# Patient Record
Sex: Male | Born: 1990 | Race: Black or African American | Hispanic: No | Marital: Single | Smoking: Former smoker
Health system: Southern US, Community
[De-identification: ages and names within clinical notes are randomized; demographics above are authoritative.]

## PROBLEM LIST (undated history)

## (undated) DIAGNOSIS — F4310 Post-traumatic stress disorder, unspecified: Secondary | ICD-10-CM

## (undated) DIAGNOSIS — J45909 Unspecified asthma, uncomplicated: Secondary | ICD-10-CM

## (undated) DIAGNOSIS — F1010 Alcohol abuse, uncomplicated: Secondary | ICD-10-CM

## (undated) DIAGNOSIS — F419 Anxiety disorder, unspecified: Secondary | ICD-10-CM

## (undated) DIAGNOSIS — F319 Bipolar disorder, unspecified: Secondary | ICD-10-CM

---
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
## 2010-08-21 IMAGING — CR DG ANKLE COMPLETE 3+V*L*
3 series · 3 of 3 positions shown · non-contrast
Comparison: No prior studies

CLINICAL DATA: Pain in the region of the lateral malleolus.

LEFT ANKLE COMPLETE - 3+ VIEW

[view not recorded (1 of 3)]
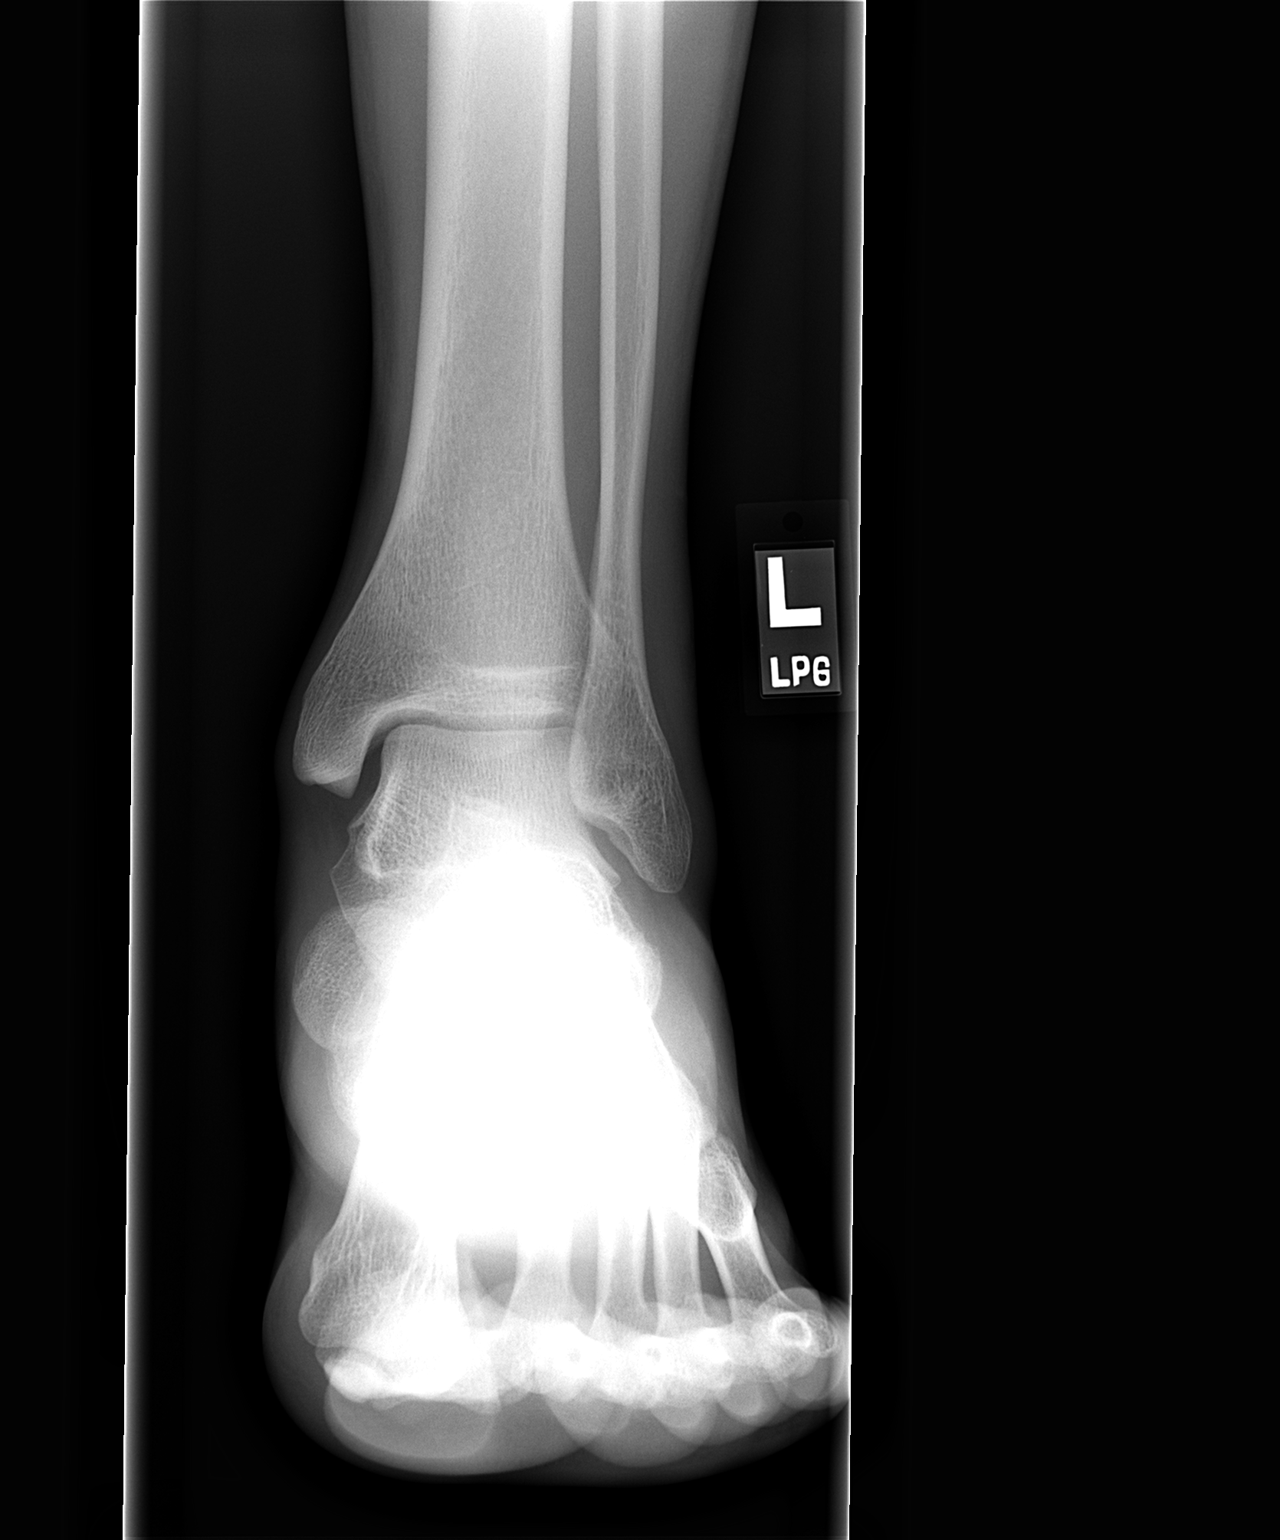

[view not recorded (2 of 3)]
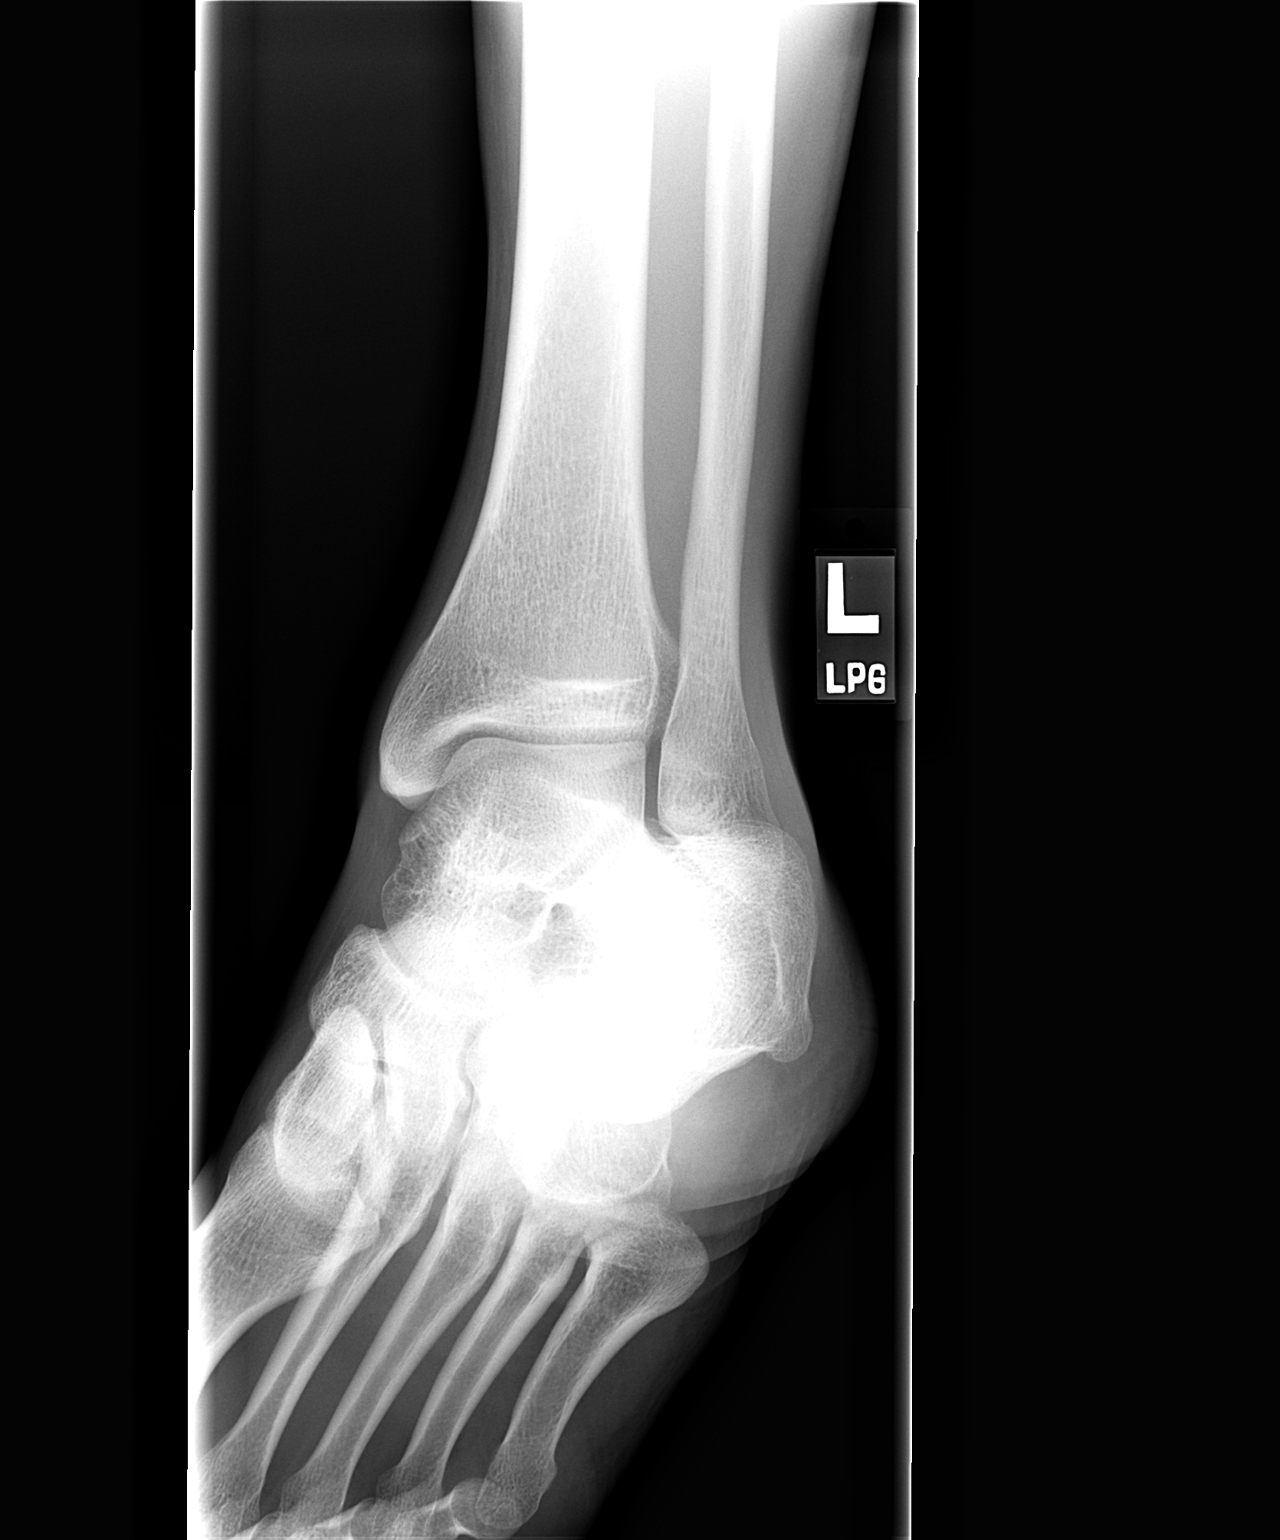

[view not recorded (3 of 3)]
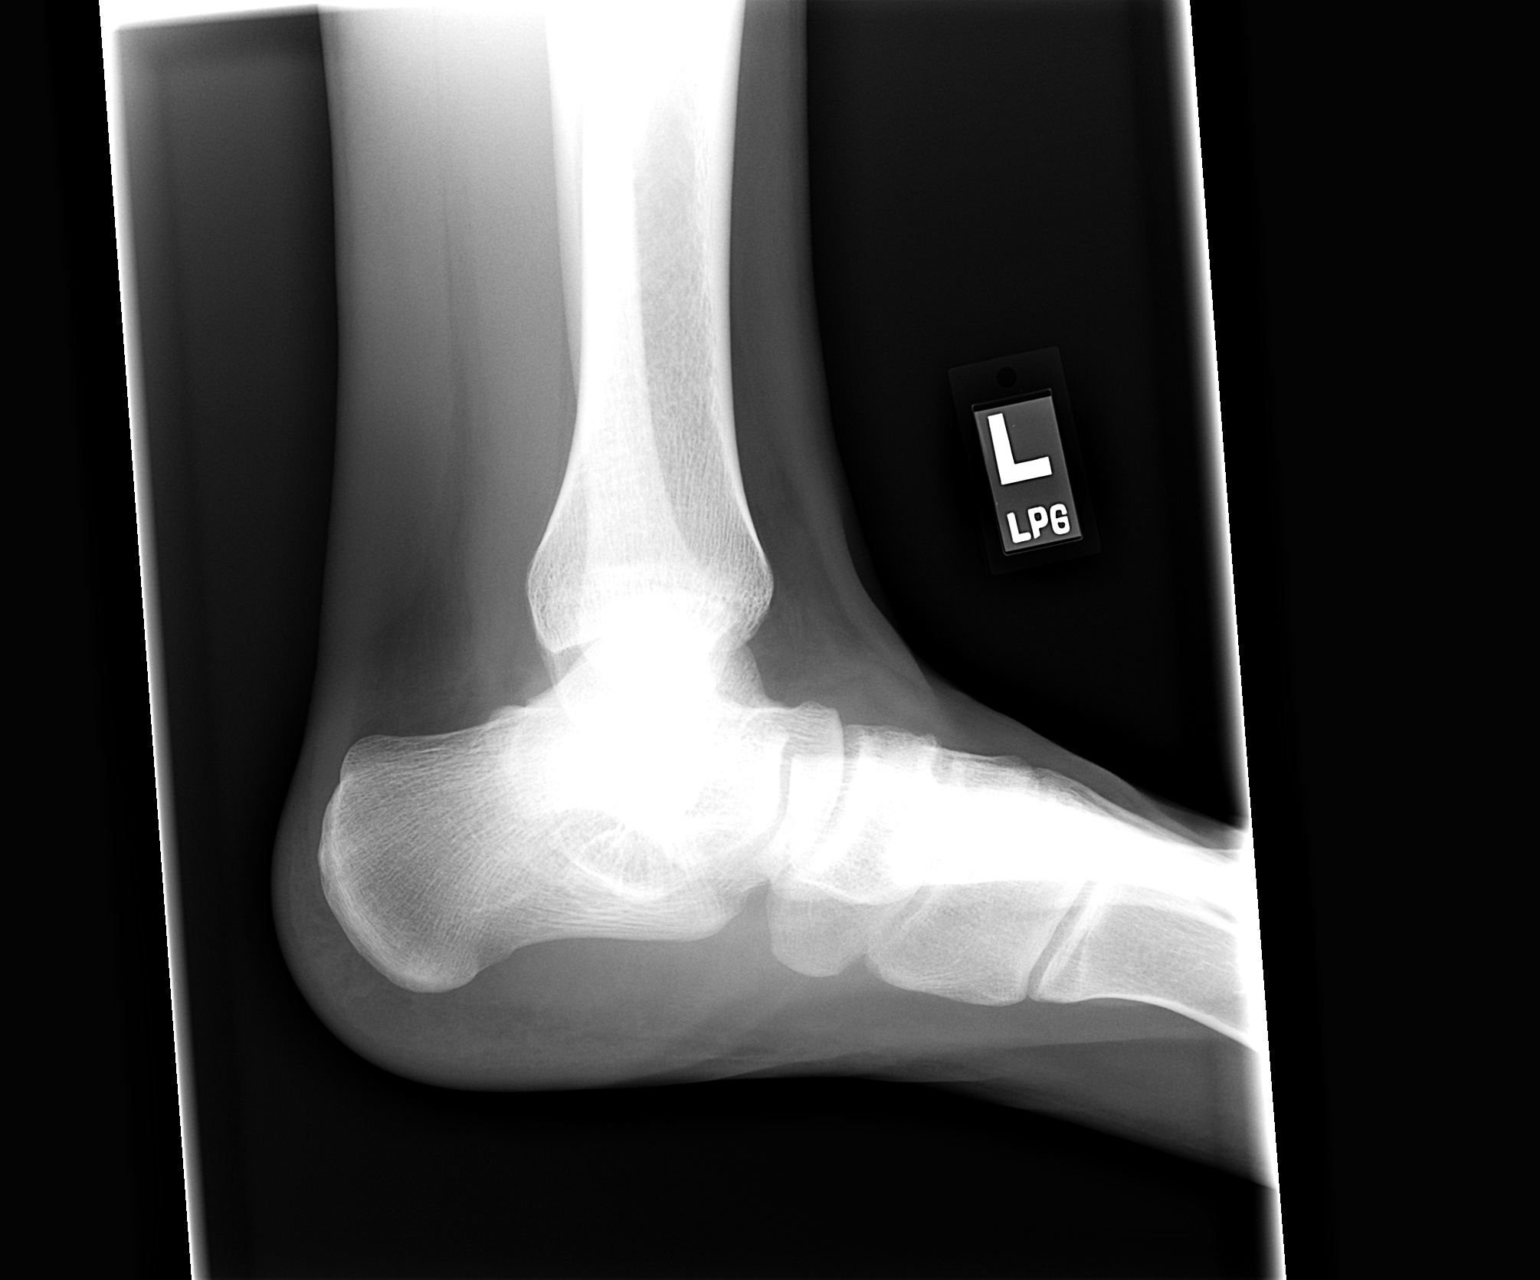

[3 of 3 positions shown; findings below may reference images not displayed]

FINDINGS: There are no fractures or dislocations.  The tibio -
talar joint space is normally maintained.  There are no destructive
changes.
IMPRESSION: Negative study.

## 2011-05-18 LAB — URINE MICROSCOPIC-ADD ON

## 2011-05-18 LAB — URINALYSIS, ROUTINE W REFLEX MICROSCOPIC
Bilirubin Urine: NEGATIVE
Glucose, UA: NEGATIVE
Hgb urine dipstick: NEGATIVE
Ketones, ur: NEGATIVE
Leukocytes, UA: NEGATIVE
Nitrite: NEGATIVE
Protein, ur: 30 — AB
Specific Gravity, Urine: 1.024
Urobilinogen, UA: 0.2
pH: 7

## 2011-05-18 LAB — URINE CULTURE: Colony Count: 6000

## 2011-05-18 LAB — GC/CHLAMYDIA PROBE AMP, GENITAL
Chlamydia, DNA Probe: NEGATIVE
GC Probe Amp, Genital: NEGATIVE

## 2015-10-23 ENCOUNTER — Emergency Department (HOSPITAL_COMMUNITY)
Admission: EM | Admit: 2015-10-23 | Discharge: 2015-10-23 | Disposition: A | Discharge status: Home / Self Care | Payer: Medicaid Other | Attending: Emergency Medicine | Admitting: Emergency Medicine

## 2015-10-23 ENCOUNTER — Emergency Department (HOSPITAL_COMMUNITY): Payer: Medicaid Other

## 2015-10-23 ENCOUNTER — Encounter (HOSPITAL_COMMUNITY): Payer: Self-pay | Admitting: Emergency Medicine

## 2015-10-23 DIAGNOSIS — S93401A Sprain of unspecified ligament of right ankle, initial encounter: Secondary | ICD-10-CM | POA: Diagnosis not present

## 2015-10-23 DIAGNOSIS — Z880 Allergy status to penicillin: Secondary | ICD-10-CM | POA: Insufficient documentation

## 2015-10-23 DIAGNOSIS — F17200 Nicotine dependence, unspecified, uncomplicated: Secondary | ICD-10-CM | POA: Diagnosis not present

## 2015-10-23 DIAGNOSIS — Y9289 Other specified places as the place of occurrence of the external cause: Secondary | ICD-10-CM | POA: Insufficient documentation

## 2015-10-23 DIAGNOSIS — W1789XA Other fall from one level to another, initial encounter: Secondary | ICD-10-CM | POA: Insufficient documentation

## 2015-10-23 DIAGNOSIS — Y999 Unspecified external cause status: Secondary | ICD-10-CM | POA: Insufficient documentation

## 2015-10-23 DIAGNOSIS — S99911A Unspecified injury of right ankle, initial encounter: Secondary | ICD-10-CM | POA: Diagnosis present

## 2015-10-23 DIAGNOSIS — Y9339 Activity, other involving climbing, rappelling and jumping off: Secondary | ICD-10-CM | POA: Diagnosis not present

## 2015-10-23 MED ORDER — TRAMADOL HCL 50 MG PO TABS
50.0000 mg | ORAL_TABLET | Freq: Once | ORAL | Status: AC
  Administered 2015-10-23: 50 mg via ORAL
  Filled 2015-10-23: qty 1

## 2015-10-23 MED ORDER — NAPROXEN 500 MG PO TABS: 500.0000 mg | ORAL_TABLET | Freq: Two times a day (BID) | ORAL | Status: DC

## 2015-10-23 MED ORDER — TRAMADOL HCL 50 MG PO TABS: 50.0000 mg | ORAL_TABLET | Freq: Four times a day (QID) | ORAL | Status: DC | PRN
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
## 2015-10-23 NOTE — ED Notes (Signed)
Ortho paged. 

## 2015-10-23 NOTE — Discharge Instructions (Signed)
Elevate the area, apply ice and take the medication as directed. Do not take the narcotic if driving as it will make you sleepy. Follow up with DR. Eulah PontMurphy if symptoms persist.
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
## 2017-08-30 ENCOUNTER — Emergency Department (HOSPITAL_COMMUNITY)
Admission: EM | Admit: 2017-08-30 | Discharge: 2017-08-30 | Disposition: A | Discharge status: Home / Self Care | Payer: Self-pay | Attending: Emergency Medicine | Admitting: Emergency Medicine

## 2017-08-30 ENCOUNTER — Encounter (HOSPITAL_COMMUNITY): Payer: Self-pay

## 2017-08-30 DIAGNOSIS — F17200 Nicotine dependence, unspecified, uncomplicated: Secondary | ICD-10-CM | POA: Insufficient documentation

## 2017-08-30 DIAGNOSIS — F10920 Alcohol use, unspecified with intoxication, uncomplicated: Secondary | ICD-10-CM | POA: Insufficient documentation

## 2017-08-30 LAB — CBG MONITORING, ED: Glucose-Capillary: 102 mg/dL — ABNORMAL HIGH (ref 65–99)

## 2017-08-30 MED ORDER — ONDANSETRON 4 MG PO TBDP
4.0000 mg | ORAL_TABLET | Freq: Once | ORAL | Status: AC
  Administered 2017-08-30: 4 mg via ORAL
  Filled 2017-08-30: qty 1

## 2017-08-30 NOTE — ED Notes (Signed)
Bed: WLPT4 Expected date:  Expected time:  Means of arrival:  Comments: 
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
## 2017-08-30 NOTE — ED Provider Notes (Signed)
Patient here for alcohol intoxication.  I assume the care from Dr. Elesa MassedWard at 8:00 this morning.  Please see her note for additional details.  Plan is to allow the patient to metabolize to freedom.  1:37 PM Patient is awake and alert, able to ambulate. The patient is safe for discharge with strict return precautions.  Disposition: Discharge  Condition: Good  I have discussed the results, Dx and Tx plan with the patient who expressed understanding and agree(s) with the plan. Discharge instructions discussed at great length. The patient was given strict return precautions who verbalized understanding of the instructions. No further questions at time of discharge.    ED Discharge Orders    None        Dakota Williams, Dakota GarnetPedro Eduardo, MD 08/30/17 1337
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
## 2017-08-30 NOTE — ED Notes (Signed)
Patient can find a ride home Dr Eudelia Bunchardama will discharge patient. Patient made aware.

## 2017-08-30 NOTE — ED Notes (Signed)
ED Provider at bedside. 

## 2018-03-08 ENCOUNTER — Emergency Department (HOSPITAL_COMMUNITY)
Admission: EM | Admit: 2018-03-08 | Discharge: 2018-03-08 | Payer: Self-pay | Attending: Emergency Medicine | Admitting: Emergency Medicine

## 2018-03-08 ENCOUNTER — Other Ambulatory Visit: Payer: Self-pay

## 2018-03-08 DIAGNOSIS — S0083XA Contusion of other part of head, initial encounter: Secondary | ICD-10-CM | POA: Insufficient documentation

## 2018-03-08 DIAGNOSIS — Y929 Unspecified place or not applicable: Secondary | ICD-10-CM | POA: Insufficient documentation

## 2018-03-08 DIAGNOSIS — Z23 Encounter for immunization: Secondary | ICD-10-CM | POA: Insufficient documentation

## 2018-03-08 DIAGNOSIS — Y9389 Activity, other specified: Secondary | ICD-10-CM | POA: Insufficient documentation

## 2018-03-08 DIAGNOSIS — F17200 Nicotine dependence, unspecified, uncomplicated: Secondary | ICD-10-CM | POA: Insufficient documentation

## 2018-03-08 DIAGNOSIS — Y998 Other external cause status: Secondary | ICD-10-CM | POA: Insufficient documentation

## 2018-03-08 DIAGNOSIS — T148XXA Other injury of unspecified body region, initial encounter: Secondary | ICD-10-CM

## 2018-03-08 DIAGNOSIS — S30811A Abrasion of abdominal wall, initial encounter: Secondary | ICD-10-CM | POA: Insufficient documentation

## 2018-03-08 MED ORDER — TETANUS-DIPHTH-ACELL PERTUSSIS 5-2.5-18.5 LF-MCG/0.5 IM SUSP
0.5000 mL | Freq: Once | INTRAMUSCULAR | Status: AC
  Administered 2018-03-08: 0.5 mL via INTRAMUSCULAR
  Filled 2018-03-08: qty 0.5

## 2018-03-08 NOTE — Discharge Instructions (Addendum)
Apply ice to your face. Return to ER if you have any vision problems, confusion, vomiting, or balance problems.
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
## 2018-03-08 NOTE — ED Notes (Signed)
Bed: WLPT2 Expected date:  Expected time:  Means of arrival:  Comments: 
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
## 2018-03-24 ENCOUNTER — Emergency Department (HOSPITAL_COMMUNITY): Payer: Self-pay

## 2018-03-24 ENCOUNTER — Other Ambulatory Visit: Payer: Self-pay

## 2018-03-24 ENCOUNTER — Encounter (HOSPITAL_COMMUNITY): Payer: Self-pay | Admitting: *Deleted

## 2018-03-24 ENCOUNTER — Emergency Department (HOSPITAL_COMMUNITY)
Admission: EM | Admit: 2018-03-24 | Discharge: 2018-03-24 | Disposition: A | Discharge status: Home / Self Care | Payer: Self-pay | Attending: Emergency Medicine | Admitting: Emergency Medicine

## 2018-03-24 DIAGNOSIS — Z79899 Other long term (current) drug therapy: Secondary | ICD-10-CM | POA: Insufficient documentation

## 2018-03-24 DIAGNOSIS — F419 Anxiety disorder, unspecified: Secondary | ICD-10-CM | POA: Insufficient documentation

## 2018-03-24 DIAGNOSIS — J45909 Unspecified asthma, uncomplicated: Secondary | ICD-10-CM | POA: Insufficient documentation

## 2018-03-24 DIAGNOSIS — R42 Dizziness and giddiness: Secondary | ICD-10-CM | POA: Insufficient documentation

## 2018-03-24 DIAGNOSIS — F10920 Alcohol use, unspecified with intoxication, uncomplicated: Secondary | ICD-10-CM | POA: Insufficient documentation

## 2018-03-24 DIAGNOSIS — F17200 Nicotine dependence, unspecified, uncomplicated: Secondary | ICD-10-CM | POA: Insufficient documentation

## 2018-03-24 DIAGNOSIS — R0602 Shortness of breath: Secondary | ICD-10-CM | POA: Insufficient documentation

## 2018-03-24 DIAGNOSIS — Y906 Blood alcohol level of 120-199 mg/100 ml: Secondary | ICD-10-CM | POA: Insufficient documentation

## 2018-03-24 HISTORY — DX: Anxiety disorder, unspecified: F41.9

## 2018-03-24 HISTORY — DX: Unspecified asthma, uncomplicated: J45.909

## 2018-03-24 HISTORY — DX: Bipolar disorder, unspecified: F31.9

## 2018-03-24 LAB — CBC WITH DIFFERENTIAL/PLATELET
Basophils Absolute: 0 10*3/uL (ref 0.0–0.1)
Basophils Relative: 0 %
Eosinophils Absolute: 0.1 10*3/uL (ref 0.0–0.7)
Eosinophils Relative: 1 %
HCT: 45.5 % (ref 39.0–52.0)
Hemoglobin: 15.4 g/dL (ref 13.0–17.0)
Lymphocytes Relative: 23 %
Lymphs Abs: 2.9 10*3/uL (ref 0.7–4.0)
MCH: 32.3 pg (ref 26.0–34.0)
MCHC: 33.8 g/dL (ref 30.0–36.0)
MCV: 95.4 fL (ref 78.0–100.0)
Monocytes Absolute: 0.5 10*3/uL (ref 0.1–1.0)
Monocytes Relative: 4 %
Neutro Abs: 9 10*3/uL — ABNORMAL HIGH (ref 1.7–7.7)
Neutrophils Relative %: 72 %
Platelets: 223 10*3/uL (ref 150–400)
RBC: 4.77 MIL/uL (ref 4.22–5.81)
RDW: 12.4 % (ref 11.5–15.5)
WBC: 12.5 10*3/uL — ABNORMAL HIGH (ref 4.0–10.5)

## 2018-03-24 LAB — BASIC METABOLIC PANEL
Anion gap: 11 (ref 5–15)
BUN: 9 mg/dL (ref 6–20)
CO2: 25 mmol/L (ref 22–32)
Calcium: 8.5 mg/dL — ABNORMAL LOW (ref 8.9–10.3)
Chloride: 107 mmol/L (ref 98–111)
Creatinine, Ser: 1 mg/dL (ref 0.61–1.24)
GFR calc Af Amer: >60 mL/min (ref >60)
GFR calc non Af Amer: >60 mL/min (ref >60)
Glucose, Bld: 92 mg/dL (ref 70–99)
Potassium: 3.9 mmol/L (ref 3.5–5.1)
Sodium: 143 mmol/L (ref 135–145)

## 2018-03-24 LAB — RAPID URINE DRUG SCREEN, HOSP PERFORMED
Amphetamines: NOT DETECTED
Barbiturates: NOT DETECTED
Benzodiazepines: NOT DETECTED
Cocaine: NOT DETECTED
Opiates: NOT DETECTED
Tetrahydrocannabinol: NOT DETECTED

## 2018-03-24 LAB — I-STAT TROPONIN, ED: Troponin i, poc: 0 ng/mL (ref 0.00–0.08)

## 2018-03-24 LAB — D-DIMER, QUANTITATIVE: D-Dimer, Quant: <0.27 ug/mL-FEU (ref 0.00–0.50)

## 2018-03-24 LAB — ETHANOL: Alcohol, Ethyl (B): 199 mg/dL — ABNORMAL HIGH (ref <10)

## 2018-03-24 MED ORDER — SODIUM CHLORIDE 0.9 % IV BOLUS
1000.0000 mL | Freq: Once | INTRAVENOUS | Status: AC
  Administered 2018-03-24: 1000 mL via INTRAVENOUS

## 2018-03-24 MED ORDER — HALOPERIDOL LACTATE 5 MG/ML IJ SOLN
2.0000 mg | Freq: Once | INTRAMUSCULAR | Status: AC
  Administered 2018-03-24: 2 mg via INTRAVENOUS
  Filled 2018-03-24: qty 1

## 2018-03-24 MED ORDER — LORAZEPAM 2 MG/ML IJ SOLN: 0.5000 mg | Freq: Once | INTRAMUSCULAR | Status: DC

## 2018-03-24 NOTE — ED Notes (Signed)
RN went in and pt was attempting to sleep again. RN turned on light and told pt to please eat. Pt unhappy with RN for turning on light

## 2018-03-24 NOTE — ED Notes (Signed)
Tray ordered for patient.

## 2018-03-24 NOTE — ED Notes (Signed)
Bed: [id number] Expected date:  Expected time:  Means of arrival:  Comments: 27 yo M/ Shortness of breath

## 2018-03-24 NOTE — ED Triage Notes (Signed)
PT arrives via ems from a parking lot with c/o sob that pt relates to anxiety. Hx of panic attacks. vss en route.

## 2018-03-24 NOTE — ED Provider Notes (Signed)
27 year old male who was signed out to me for shortness of breath and anxiety. Has hx of alcohol abuse and has been having recent issues at home. He had some tachycardia initially in the ED which has resolved.  Previous provider has ordered blood work which is still in process. Labs that are back are overall unremarkable other than mild leukocytosis and ETOH of 199. Plan is to treat anxiety and await results of d-dimer.  D-dimer is negative.  He was given low-dose Haldol.  We will recheck in an hour.   7:56 AM The patient is resting. Before I woke him up his HR was in the 80s. When I woke him up to talk and give results it went to 130s. He complains of dizziness. Will continue to monitor.  10:09 AM HR has stayed consistently normal. He is still somewhat difficult to arouse. Will continue to monitor.  11:24 AM He is alert and has eating a small amount but not very hungry. He states that he feels like "a tree that's been hit with a strong wind". I spoke with his wife Jon Gillslexis, who is willing to pick him up. Will d/c.      Bethel BornGekas, Taralyn Ferraiolo Marie, PA-C 03/24/18 1125    Mancel BaleWentz, Elliott, MD 03/24/18 [phone]

## 2018-03-24 NOTE — ED Notes (Signed)
Pt was given a tray and encouraged to eat

## 2018-03-24 NOTE — ED Provider Notes (Signed)
[facility]-EMERGENCY DEPT Provider Note   CSN: [id number] Arrival date & time: 03/24/18  0349     History   Chief Complaint Chief Complaint  Patient presents with  . Anxiety    HPI Dakota Williams is a 27 y.o. male.  HPI 27 year old AA male presents to the emergency department today for evaluation of shortness of breath.  Patient states that he was having a hard time walking yesterday and some intermittent dizziness.  Patient states that he developed some shortness of breath last night.  Patient reports 140 ounce beer prior to arrival.  States that he has a lot of anxiety and stress currently.  States he has history of panic attacks and this feels very similar.  Denies any chest pain.  He has not taken anything for his symptoms.  States is been out of his anxiety medication for the past 2 weeks.  Patient denies any cardiac problems.  Denies any history of PE/DVT, prolonged immobilization, recent hospitalization/surgeries, unilateral leg swelling or calf tenderness.  Patient denies any drug use.  Nothing makes her symptoms better or worse.  Reports history of same.  Pt denies any fever, chill, ha, vision changes, congestion, neck pain, cp,, cough, abd pain, n/v/d, urinary symptoms, change in bowel habits, melena, hematochezia, lower extremity paresthesias.  Past Medical History:  Diagnosis Date  . Anxiety   . Asthma   . Bipolar 1 disorder (HCC)     There are no active problems to display for this patient.   History reviewed. No pertinent surgical history.      Home Medications    Prior to Admission medications   Medication Sig Start Date End Date Taking? Authorizing Provider  citalopram (CELEXA) 10 MG tablet Take 10 mg by mouth daily.   Yes [provider]    Family History No family history on file.  Social History Social History   Tobacco Use  . Smoking status: Current Every Day Smoker  . Smokeless tobacco: Never Used  Substance  Use Topics  . Alcohol use: Yes  . Drug use: No     Allergies   Naproxen; Penicillins; Shellfish allergy; and Vicodin [hydrocodone-acetaminophen]   Review of Systems Review of Systems  All other systems reviewed and are negative.    Physical Exam Updated Vital Signs BP 103/61   Pulse (!) 113   Temp 98.6 F (37 C)   Resp 15   SpO2 96%   Physical Exam  Constitutional: He is oriented to person, place, and time. He appears well-developed and well-nourished.  Non-toxic appearance. No distress.  HENT:  Head: Normocephalic and atraumatic.  Nose: Nose normal.  Mouth/Throat: Oropharynx is clear and moist.  Eyes: Pupils are equal, round, and reactive to light. Conjunctivae are normal. Right eye exhibits no discharge. Left eye exhibits no discharge.  Neck: Normal range of motion. Neck supple. No JVD present. No tracheal deviation present.  Cardiovascular: Regular rhythm, normal heart sounds and intact distal pulses. Exam reveals no gallop and no friction rub.  No murmur heard. Tachycardia noted  Pulmonary/Chest: Effort normal and breath sounds normal. No stridor. No respiratory distress. He has no wheezes. He has no rales. He exhibits no tenderness.  No hypoxia or tachypnea.  Abdominal: Soft. Bowel sounds are normal. He exhibits no distension. There is no tenderness. There is no rebound and no guarding.  Musculoskeletal: Normal range of motion.  No lower extremity edema or calf tenderness.  Lymphadenopathy:    He has no cervical adenopathy.  Neurological: He is alert and oriented to person, place, and time.  Skin: Skin is warm and dry. Capillary refill takes less than 2 seconds. He is not diaphoretic.  Psychiatric: His behavior is normal. Judgment and thought content normal.  Nursing note and vitals reviewed.    ED Treatments / Results  Labs (all labs ordered are listed, but only abnormal results are displayed) Labs Reviewed  CBC WITH DIFFERENTIAL/PLATELET - Abnormal;  Notable for the following components:      Result Value   WBC 12.5 (*)    Neutro Abs 9.0 (*)    All other components within normal limits  RAPID URINE DRUG SCREEN, HOSP PERFORMED  BASIC METABOLIC PANEL  ETHANOL  D-DIMER, QUANTITATIVE (NOT AT [facility])  I-STAT TROPONIN, ED    EKG EKG Interpretation  Date/Time:  Monday March 24 2018 06:00:12 EDT Ventricular Rate:  91 PR Interval:    QRS Duration: 101 QT Interval:  365 QTC Calculation: 450 R Axis:   82 Text Interpretation:  Sinus rhythm Confirmed by Palumbo, April ([id number]) on 03/24/2018 6:03:47 AM   Radiology Dg Chest 2 View  Result Date: 03/24/2018 CLINICAL DATA:  27 y/o  M; shortness of breath, anxiety, smoker. EXAM: CHEST - 2 VIEW COMPARISON:  09/30/2016 chest radiograph. FINDINGS: Stable heart size and mediastinal contours are within normal limits. Both lungs are clear. The visualized skeletal structures are unremarkable. IMPRESSION: No active cardiopulmonary disease. Electronically Signed   By: Mitzi HansenLance  Furusawa-Stratton M.D.   On: 03/24/2018 05:36    Procedures Procedures (including critical care time)  Medications Ordered in ED Medications  sodium chloride 0.9 % bolus 1,000 mL (1,000 mLs Intravenous New Bag/Given 03/24/18 0543)  sodium chloride 0.9 % bolus 1,000 mL (1,000 mLs Intravenous New Bag/Given 03/24/18 0542)     Initial Impression / Assessment and Plan / ED Course  I have reviewed the triage vital signs and the nursing notes.  Pertinent labs & imaging results that were available during my care of the patient were reviewed by me and considered in my medical decision making (see chart for details).     Pt presents to the ED for evaluation of shortness of breath that he relates to his anxiety with associated intermittent dizziness yesterday.  Patient reports history of tobacco use.  He has not been taking his anxiety depression medications over the past week.  She denies chest pain.  Mild tachycardia noted however patient  is afebrile and no hypotension.  Patient is not hypoxic or tachypneic.  Heart regular rhythm without any rubs murmurs or gallops.  Lungs clear to auscultation bilaterally.  No focal abdominal tenderness.  Neurovascular intact all extremities.  Follows commands appropriately.  Alert and oriented x3.  Patient reports alcohol use.  Chest x-ray reassuring.  Elevated alcohol noted.  Otherwise labs reassuring.  Awaiting d-dimer.  Patient will be given Haldol given normal EKG.   [facility]. Pt has pending at this time d dimer, haldol, and reassessment.  Disposition likely home pending lab and test results. Care dicussed and plan agreed upon with oncoming PA. Pt updated on plan of care and is currently hemodynamically stable at this time with normal vs.     Final Clinical Impressions(s) / ED Diagnoses   Final diagnoses:  Alcoholic intoxication without complication [facility](HCC)  Anxiety    ED Discharge Orders    None       Wallace KellerLeaphart, Shanti Agresti T, PA-C 03/26/18 0715    Palumbo, April, MD 03/26/18 [phone]

## 2018-09-01 ENCOUNTER — Other Ambulatory Visit: Payer: Self-pay

## 2018-09-01 ENCOUNTER — Emergency Department (HOSPITAL_COMMUNITY)
Admission: EM | Admit: 2018-09-01 | Discharge: 2018-09-01 | Disposition: A | Discharge status: Home / Self Care | Payer: Medicaid Other | Attending: Emergency Medicine | Admitting: Emergency Medicine

## 2018-09-01 DIAGNOSIS — Z5321 Procedure and treatment not carried out due to patient leaving prior to being seen by health care provider: Secondary | ICD-10-CM | POA: Insufficient documentation

## 2018-09-01 DIAGNOSIS — F419 Anxiety disorder, unspecified: Secondary | ICD-10-CM | POA: Insufficient documentation

## 2018-09-01 LAB — CBC WITH DIFFERENTIAL/PLATELET
Abs Immature Granulocytes: 0.03 10*3/uL (ref 0.00–0.07)
Basophils Absolute: 0.1 10*3/uL (ref 0.0–0.1)
Basophils Relative: 1 %
Eosinophils Absolute: 0.2 10*3/uL (ref 0.0–0.5)
Eosinophils Relative: 2 %
HCT: 48.7 % (ref 39.0–52.0)
Hemoglobin: 15.8 g/dL (ref 13.0–17.0)
Immature Granulocytes: 0 %
Lymphocytes Relative: 36 %
Lymphs Abs: 3.5 10*3/uL (ref 0.7–4.0)
MCH: 31 pg (ref 26.0–34.0)
MCHC: 32.4 g/dL (ref 30.0–36.0)
MCV: 95.5 fL (ref 80.0–100.0)
Monocytes Absolute: 0.4 10*3/uL (ref 0.1–1.0)
Monocytes Relative: 4 %
Neutro Abs: 5.7 10*3/uL (ref 1.7–7.7)
Neutrophils Relative %: 57 %
Platelets: 260 10*3/uL (ref 150–400)
RBC: 5.1 MIL/uL (ref 4.22–5.81)
RDW: 11.7 % (ref 11.5–15.5)
WBC: 9.9 10*3/uL (ref 4.0–10.5)
nRBC: 0 % (ref 0.0–0.2)

## 2018-09-01 LAB — BASIC METABOLIC PANEL
Anion gap: 11 (ref 5–15)
BUN: 13 mg/dL (ref 6–20)
CO2: 25 mmol/L (ref 22–32)
Calcium: 9.2 mg/dL (ref 8.9–10.3)
Chloride: 108 mmol/L (ref 98–111)
Creatinine, Ser: 1.28 mg/dL — ABNORMAL HIGH (ref 0.61–1.24)
GFR calc Af Amer: >60 mL/min (ref >60)
GFR calc non Af Amer: >60 mL/min (ref >60)
Glucose, Bld: 120 mg/dL — ABNORMAL HIGH (ref 70–99)
Potassium: 4.5 mmol/L (ref 3.5–5.1)
Sodium: 144 mmol/L (ref 135–145)

## 2018-09-01 NOTE — ED Notes (Signed)
Bed: WLPT3 Expected date:  Expected time:  Means of arrival:  Comments: 
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
## 2018-09-01 NOTE — ED Notes (Signed)
Pt states "I don't need to be here, there's nothing wrong with me" and walked out

## 2018-09-02 ENCOUNTER — Encounter (HOSPITAL_COMMUNITY): Payer: Self-pay | Admitting: Behavioral Health

## 2018-09-02 ENCOUNTER — Ambulatory Visit (HOSPITAL_COMMUNITY)
Admission: RE | Admit: 2018-09-02 | Discharge: 2018-09-02 | Disposition: A | Discharge status: Home / Self Care | Payer: Self-pay | Attending: Psychiatry | Admitting: Psychiatry

## 2018-09-02 DIAGNOSIS — F319 Bipolar disorder, unspecified: Secondary | ICD-10-CM | POA: Insufficient documentation

## 2018-09-02 DIAGNOSIS — Z886 Allergy status to analgesic agent status: Secondary | ICD-10-CM | POA: Insufficient documentation

## 2018-09-02 DIAGNOSIS — Z885 Allergy status to narcotic agent status: Secondary | ICD-10-CM | POA: Insufficient documentation

## 2018-09-02 DIAGNOSIS — F419 Anxiety disorder, unspecified: Secondary | ICD-10-CM | POA: Insufficient documentation

## 2018-09-02 DIAGNOSIS — Z91013 Allergy to seafood: Secondary | ICD-10-CM | POA: Insufficient documentation

## 2018-09-02 DIAGNOSIS — Z880 Allergy status to penicillin: Secondary | ICD-10-CM | POA: Insufficient documentation
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
## 2018-09-16 ENCOUNTER — Emergency Department (HOSPITAL_COMMUNITY)
Admission: EM | Admit: 2018-09-16 | Discharge: 2018-09-17 | Disposition: A | Discharge status: Other Acute Inpatient Hospital | Payer: Medicaid Other | Attending: Emergency Medicine | Admitting: Emergency Medicine

## 2018-09-16 ENCOUNTER — Encounter (HOSPITAL_COMMUNITY): Payer: Self-pay | Admitting: *Deleted

## 2018-09-16 DIAGNOSIS — F1024 Alcohol dependence with alcohol-induced mood disorder: Secondary | ICD-10-CM | POA: Insufficient documentation

## 2018-09-16 DIAGNOSIS — F419 Anxiety disorder, unspecified: Secondary | ICD-10-CM | POA: Insufficient documentation

## 2018-09-16 DIAGNOSIS — Z79899 Other long term (current) drug therapy: Secondary | ICD-10-CM | POA: Insufficient documentation

## 2018-09-16 DIAGNOSIS — F319 Bipolar disorder, unspecified: Secondary | ICD-10-CM | POA: Insufficient documentation

## 2018-09-16 DIAGNOSIS — F10920 Alcohol use, unspecified with intoxication, uncomplicated: Secondary | ICD-10-CM

## 2018-09-16 DIAGNOSIS — F17200 Nicotine dependence, unspecified, uncomplicated: Secondary | ICD-10-CM | POA: Insufficient documentation

## 2018-09-16 HISTORY — DX: Alcohol abuse, uncomplicated: F10.10

## 2018-09-16 LAB — COMPREHENSIVE METABOLIC PANEL
ALT: 30 U/L (ref 0–44)
AST: 44 U/L — ABNORMAL HIGH (ref 15–41)
Albumin: 4.6 g/dL (ref 3.5–5.0)
Alkaline Phosphatase: 48 U/L (ref 38–126)
Anion gap: 9 (ref 5–15)
BUN: 11 mg/dL (ref 6–20)
CO2: 27 mmol/L (ref 22–32)
Calcium: 8.7 mg/dL — ABNORMAL LOW (ref 8.9–10.3)
Chloride: 106 mmol/L (ref 98–111)
Creatinine, Ser: 1.03 mg/dL (ref 0.61–1.24)
GFR calc Af Amer: >60 mL/min (ref >60)
GFR calc non Af Amer: >60 mL/min (ref >60)
Glucose, Bld: 84 mg/dL (ref 70–99)
Potassium: 4.2 mmol/L (ref 3.5–5.1)
Sodium: 142 mmol/L (ref 135–145)
Total Bilirubin: 0.7 mg/dL (ref 0.3–1.2)
Total Protein: 7.1 g/dL (ref 6.5–8.1)

## 2018-09-16 LAB — CBC
HCT: 47.1 % (ref 39.0–52.0)
Hemoglobin: 15.3 g/dL (ref 13.0–17.0)
MCH: 31 pg (ref 26.0–34.0)
MCHC: 32.5 g/dL (ref 30.0–36.0)
MCV: 95.3 fL (ref 80.0–100.0)
Platelets: 243 10*3/uL (ref 150–400)
RBC: 4.94 MIL/uL (ref 4.22–5.81)
RDW: 11.9 % (ref 11.5–15.5)
WBC: 7.9 10*3/uL (ref 4.0–10.5)
nRBC: 0 % (ref 0.0–0.2)

## 2018-09-16 LAB — SALICYLATE LEVEL: Salicylate Lvl: <7 mg/dL (ref 2.8–30.0)

## 2018-09-16 LAB — RAPID URINE DRUG SCREEN, HOSP PERFORMED
Amphetamines: NOT DETECTED
Barbiturates: NOT DETECTED
Benzodiazepines: POSITIVE — AB
Cocaine: NOT DETECTED
Opiates: NOT DETECTED
Tetrahydrocannabinol: NOT DETECTED

## 2018-09-16 LAB — ACETAMINOPHEN LEVEL: Acetaminophen (Tylenol), Serum: <10 ug/mL — ABNORMAL LOW (ref 10–30)

## 2018-09-16 LAB — ETHANOL: Alcohol, Ethyl (B): 171 mg/dL — ABNORMAL HIGH (ref <10)

## 2018-09-16 MED ORDER — ALUM & MAG HYDROXIDE-SIMETH 200-200-20 MG/5ML PO SUSP: 30.0000 mL | Freq: Four times a day (QID) | ORAL | Status: DC | PRN

## 2018-09-16 MED ORDER — ONDANSETRON HCL 4 MG PO TABS
4.0000 mg | ORAL_TABLET | Freq: Three times a day (TID) | ORAL | Status: DC | PRN
  Administered 2018-09-16: 4 mg via ORAL
  Filled 2018-09-16: qty 1

## 2018-09-16 MED ORDER — METHOCARBAMOL 500 MG PO TABS
500.0000 mg | ORAL_TABLET | Freq: Three times a day (TID) | ORAL | Status: DC | PRN
  Administered 2018-09-16: 500 mg via ORAL
  Filled 2018-09-16: qty 1

## 2018-09-16 MED ORDER — CLONIDINE HCL 0.1 MG PO TABS: 0.1000 mg | ORAL_TABLET | ORAL | Status: DC

## 2018-09-16 MED ORDER — CLONIDINE HCL 0.1 MG PO TABS: 0.1000 mg | ORAL_TABLET | Freq: Every day | ORAL | Status: DC

## 2018-09-16 MED ORDER — HYDROXYZINE HCL 25 MG PO TABS
25.0000 mg | ORAL_TABLET | Freq: Four times a day (QID) | ORAL | Status: DC | PRN
  Administered 2018-09-16: 25 mg via ORAL
  Filled 2018-09-16: qty 1

## 2018-09-16 MED ORDER — CLONIDINE HCL 0.1 MG PO TABS
0.1000 mg | ORAL_TABLET | Freq: Four times a day (QID) | ORAL | Status: DC
  Administered 2018-09-16: 0.1 mg via ORAL
  Filled 2018-09-16: qty 1

## 2018-09-16 MED ORDER — ONDANSETRON 4 MG PO TBDP: 4.0000 mg | ORAL_TABLET | Freq: Four times a day (QID) | ORAL | Status: DC | PRN

## 2018-09-16 MED ORDER — DICYCLOMINE HCL 20 MG PO TABS: 20.0000 mg | ORAL_TABLET | Freq: Four times a day (QID) | ORAL | Status: DC | PRN

## 2018-09-16 MED ORDER — LOPERAMIDE HCL 2 MG PO CAPS: 2.0000 mg | ORAL_CAPSULE | ORAL | Status: DC | PRN

## 2018-09-16 MED ORDER — ACETAMINOPHEN 325 MG PO TABS: 650.0000 mg | ORAL_TABLET | ORAL | Status: DC | PRN

## 2018-09-16 NOTE — ED Notes (Signed)
Unable to do triage. Patient will not answer questions. patient is lethargic.
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
## 2018-09-16 NOTE — ED Notes (Signed)
Bed: [facility]42 Expected date:  Expected time:  Means of arrival:  Comments: 

## 2018-09-16 NOTE — ED Notes (Signed)
Patient reminded that he needed to obtain a urine sample for staff.  Urine cup at bedside.

## 2018-09-16 NOTE — ED Notes (Signed)
Urine cup at bedside.  Patient aware that a urine sample is needed.

## 2018-09-16 NOTE — ED Notes (Signed)
Bed: [facility] Expected date:  Expected time:  Means of arrival:  Comments: 
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
## 2018-09-16 NOTE — ED Notes (Signed)
Patient reports he is nauseated.  PRN zofran given.
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
## 2018-09-16 NOTE — ED Notes (Signed)
When trying to obtain vital signs pt kept trying to put fingers in his mouth to make himself vomit. Pt stated " if I can just get it all out i'll feel better." I asked pt several times to not do this.
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
## 2018-09-17 ENCOUNTER — Inpatient Hospital Stay (HOSPITAL_COMMUNITY)
Admission: AD | Admit: 2018-09-17 | Discharge: 2018-09-22 | DRG: 897 | Disposition: A | Discharge status: Home / Self Care | Payer: Federal, State, Local not specified - Other | Source: Intra-hospital | Attending: Psychiatry | Admitting: Psychiatry

## 2018-09-17 ENCOUNTER — Other Ambulatory Visit: Payer: Self-pay

## 2018-09-17 ENCOUNTER — Encounter (HOSPITAL_COMMUNITY): Payer: Self-pay

## 2018-09-17 DIAGNOSIS — G4700 Insomnia, unspecified: Secondary | ICD-10-CM | POA: Diagnosis present

## 2018-09-17 DIAGNOSIS — R45851 Suicidal ideations: Secondary | ICD-10-CM | POA: Diagnosis present

## 2018-09-17 DIAGNOSIS — F329 Major depressive disorder, single episode, unspecified: Secondary | ICD-10-CM | POA: Diagnosis present

## 2018-09-17 DIAGNOSIS — F322 Major depressive disorder, single episode, severe without psychotic features: Secondary | ICD-10-CM | POA: Diagnosis present

## 2018-09-17 DIAGNOSIS — F17210 Nicotine dependence, cigarettes, uncomplicated: Secondary | ICD-10-CM | POA: Diagnosis present

## 2018-09-17 DIAGNOSIS — F1024 Alcohol dependence with alcohol-induced mood disorder: Principal | ICD-10-CM | POA: Diagnosis present

## 2018-09-17 DIAGNOSIS — F411 Generalized anxiety disorder: Secondary | ICD-10-CM | POA: Diagnosis present

## 2018-09-17 DIAGNOSIS — F1010 Alcohol abuse, uncomplicated: Secondary | ICD-10-CM

## 2018-09-17 MED ORDER — LORAZEPAM 1 MG PO TABS: 1.0000 mg | ORAL_TABLET | Freq: Two times a day (BID) | ORAL | Status: DC

## 2018-09-17 MED ORDER — ADULT MULTIVITAMIN W/MINERALS CH
1.0000 | ORAL_TABLET | Freq: Every day | ORAL | Status: DC
  Administered 2018-09-17 – 2018-09-21 (×5): 1 via ORAL
  Filled 2018-09-17 (×9): qty 1

## 2018-09-17 MED ORDER — LORAZEPAM 1 MG PO TABS: 1.0000 mg | ORAL_TABLET | Freq: Three times a day (TID) | ORAL | Status: DC

## 2018-09-17 MED ORDER — THIAMINE HCL 100 MG/ML IJ SOLN: 100.0000 mg | Freq: Once | INTRAMUSCULAR | Status: DC

## 2018-09-17 MED ORDER — GABAPENTIN 300 MG PO CAPS
300.0000 mg | ORAL_CAPSULE | Freq: Once | ORAL | Status: AC
  Administered 2018-09-17: 300 mg via ORAL
  Filled 2018-09-17 (×2): qty 1

## 2018-09-17 MED ORDER — ALUM & MAG HYDROXIDE-SIMETH 200-200-20 MG/5ML PO SUSP: 30.0000 mL | ORAL | Status: DC | PRN

## 2018-09-17 MED ORDER — LORAZEPAM 1 MG PO TABS
1.0000 mg | ORAL_TABLET | Freq: Four times a day (QID) | ORAL | Status: DC
  Administered 2018-09-17: 1 mg via ORAL
  Filled 2018-09-17: qty 1

## 2018-09-17 MED ORDER — ONDANSETRON 4 MG PO TBDP: 4.0000 mg | ORAL_TABLET | Freq: Four times a day (QID) | ORAL | Status: AC | PRN

## 2018-09-17 MED ORDER — HYDROXYZINE HCL 25 MG PO TABS
25.0000 mg | ORAL_TABLET | Freq: Four times a day (QID) | ORAL | Status: DC | PRN
  Administered 2018-09-17 – 2018-09-22 (×12): 25 mg via ORAL
  Filled 2018-09-17 (×4): qty 1
  Filled 2018-09-17: qty 20
  Filled 2018-09-17 (×3): qty 1
  Filled 2018-09-17: qty 20
  Filled 2018-09-17 (×5): qty 1

## 2018-09-17 MED ORDER — MAGNESIUM HYDROXIDE 400 MG/5ML PO SUSP: 30.0000 mL | Freq: Every day | ORAL | Status: DC | PRN

## 2018-09-17 MED ORDER — LOPERAMIDE HCL 2 MG PO CAPS: 2.0000 mg | ORAL_CAPSULE | ORAL | Status: AC | PRN

## 2018-09-17 MED ORDER — TRAMADOL HCL 50 MG PO TABS
50.0000 mg | ORAL_TABLET | Freq: Four times a day (QID) | ORAL | Status: DC | PRN
  Administered 2018-09-17: 50 mg via ORAL
  Filled 2018-09-17: qty 1

## 2018-09-17 MED ORDER — TRAZODONE HCL 50 MG PO TABS
50.0000 mg | ORAL_TABLET | Freq: Every evening | ORAL | Status: DC | PRN
  Administered 2018-09-17 – 2018-09-18 (×4): 50 mg via ORAL
  Filled 2018-09-17 (×8): qty 1

## 2018-09-17 MED ORDER — LORAZEPAM 1 MG PO TABS: 1.0000 mg | ORAL_TABLET | Freq: Every day | ORAL | Status: DC

## 2018-09-17 MED ORDER — GABAPENTIN 300 MG PO CAPS
300.0000 mg | ORAL_CAPSULE | Freq: Three times a day (TID) | ORAL | Status: DC
  Administered 2018-09-17 – 2018-09-18 (×2): 300 mg via ORAL
  Filled 2018-09-17 (×6): qty 1

## 2018-09-17 MED ORDER — VITAMIN B-1 100 MG PO TABS
100.0000 mg | ORAL_TABLET | Freq: Every day | ORAL | Status: DC
  Administered 2018-09-18 – 2018-09-21 (×4): 100 mg via ORAL
  Filled 2018-09-17 (×7): qty 1

## 2018-09-17 MED ORDER — LORAZEPAM 1 MG PO TABS: 1.0000 mg | ORAL_TABLET | Freq: Four times a day (QID) | ORAL | Status: AC | PRN

## 2018-09-17 MED ORDER — HYDROXYZINE HCL 25 MG PO TABS: 25.0000 mg | ORAL_TABLET | Freq: Four times a day (QID) | ORAL | Status: DC | PRN
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
## 2018-09-17 NOTE — Tx Team (Signed)
Interdisciplinary Treatment and Diagnostic Plan Update  09/17/2018 Time of Session: 9:00am Dakota Williams MRN: [id number]  Principal Diagnosis: <principal problem not specified>  Secondary Diagnoses: Active Problems:   MDD (major depressive disorder), severe (HCC)   Current Medications:  Current Facility-Administered Medications  Medication Dose Route Frequency Provider Last Rate Last Dose  . alum & mag hydroxide-simeth (MAALOX/MYLANTA) 200-200-20 MG/5ML suspension 30 mL  30 mL Oral Q4H PRN Patriciaann Clan E, PA-C      . hydrOXYzine (ATARAX/VISTARIL) tablet 25 mg  25 mg Oral Q6H PRN Laverle Hobby, PA-C   25 mg at 09/17/18 0327  . hydrOXYzine (ATARAX/VISTARIL) tablet 25 mg  25 mg Oral Q6H PRN Patriciaann Clan E, PA-C      . loperamide (IMODIUM) capsule 2-4 mg  2-4 mg Oral PRN Laverle Hobby, PA-C      . LORazepam (ATIVAN) tablet 1 mg  1 mg Oral Q6H PRN Laverle Hobby, PA-C      . LORazepam (ATIVAN) tablet 1 mg  1 mg Oral QID Patriciaann Clan E, PA-C   1 mg at 09/17/18 1025   Followed by  . [START ON 09/18/2018] LORazepam (ATIVAN) tablet 1 mg  1 mg Oral TID Laverle Hobby, PA-C       Followed by  . [START ON 09/19/2018] LORazepam (ATIVAN) tablet 1 mg  1 mg Oral BID Patriciaann Clan E, PA-C       Followed by  . [START ON 09/21/2018] LORazepam (ATIVAN) tablet 1 mg  1 mg Oral Daily Simon, Spencer E, PA-C      . magnesium hydroxide (MILK OF MAGNESIA) suspension 30 mL  30 mL Oral Daily PRN Laverle Hobby, PA-C      . multivitamin with minerals tablet 1 tablet  1 tablet Oral Daily Laverle Hobby, PA-C   1 tablet at 09/17/18 0829  . ondansetron (ZOFRAN-ODT) disintegrating tablet 4 mg  4 mg Oral Q6H PRN Patriciaann Clan E, PA-C      . thiamine (B-1) injection 100 mg  100 mg Intramuscular Once Laverle Hobby, PA-C      . [START ON 09/18/2018] thiamine (VITAMIN B-1) tablet 100 mg  100 mg Oral Daily Simon, Spencer E, PA-C      . traZODone (DESYREL) tablet 50 mg  50 mg Oral QHS,MR X 1 Simon,  Spencer E, PA-C       PTA Medications: No medications prior to admission.    Patient Stressors: Financial difficulties Marital or family conflict Substance abuse  Patient Strengths: Curator fund of knowledge Physical Health  Treatment Modalities: Medication Management, Group therapy, Case management,  1 to 1 session with clinician, Psychoeducation, Recreational therapy.   Physician Treatment Plan for Primary Diagnosis: <principal problem not specified> Long Term Goal(s):     Short Term Goals:    Medication Management: Evaluate patient's response, side effects, and tolerance of medication regimen.  Therapeutic Interventions: 1 to 1 sessions, Unit Group sessions and Medication administration.  Evaluation of Outcomes: Not Met  Physician Treatment Plan for Secondary Diagnosis: Active Problems:   MDD (major depressive disorder), severe ([place])  Long Term Goal(s):     Short Term Goals:       Medication Management: Evaluate patient's response, side effects, and tolerance of medication regimen.  Therapeutic Interventions: 1 to 1 sessions, Unit Group sessions and Medication administration.  Evaluation of Outcomes: Not Met   RN Treatment Plan for Primary Diagnosis: <principal problem not specified> Long Term Goal(s): Knowledge of disease and  therapeutic regimen to maintain health will improve  Short Term Goals: Ability to demonstrate self-control, Ability to participate in decision making will improve, Ability to verbalize feelings will improve and Ability to identify and develop effective coping behaviors will improve  Medication Management: RN will administer medications as ordered by provider, will assess and evaluate patient's response and provide education to patient for prescribed medication. RN will report any adverse and/or side effects to prescribing provider.  Therapeutic Interventions: 1 on 1 counseling sessions, Psychoeducation, Medication  administration, Evaluate responses to treatment, Monitor vital signs and CBGs as ordered, Perform/monitor CIWA, COWS, AIMS and Fall Risk screenings as ordered, Perform wound care treatments as ordered.  Evaluation of Outcomes: Not Met   LCSW Treatment Plan for Primary Diagnosis: <principal problem not specified> Long Term Goal(s): Safe transition to appropriate next level of care at discharge, Engage patient in therapeutic group addressing interpersonal concerns.  Short Term Goals: Engage patient in aftercare planning with referrals and resources, Increase social support, Identify triggers associated with mental health/substance abuse issues and Increase skills for wellness and recovery  Therapeutic Interventions: Assess for all discharge needs, 1 to 1 time with Social worker, Explore available resources and support systems, Assess for adequacy in community support network, Educate family and significant other(s) on suicide prevention, Complete Psychosocial Assessment, Interpersonal group therapy.  Evaluation of Outcomes: Not Met   Progress in Treatment: Attending groups: No. New to unit. Participating in groups: No. Taking medication as prescribed: Yes. Toleration medication: Yes. Family/Significant other contact made: No, will contact:  supports is consents are given. Patient understands diagnosis: Yes. Discussing patient identified problems/goals with staff: Yes. Medical problems stabilized or resolved: Yes. Denies suicidal/homicidal ideation: Yes. Issues/concerns per patient self-inventory: No.  New problem(s) identified: No, Describe:  CSW continuing to assess  New Short Term/Long Term Goal(s): detox, medication management for mood stabilization; elimination of SI thoughts; development of comprehensive mental wellness/sobriety plan.  Patient Goals: "Focus on maintaining sobriety from alcohol. Fix my relationship with my wife and kids."  Discharge Plan or Barriers: Patient  requesting referrals for residential treatment. [place] pamphlet, Mobile Crisis information, and AA/NA information provided to patient for additional community support and resources.   Reason for Continuation of Hospitalization: Anxiety Depression Withdrawal symptoms  Estimated Length of Stay: 3-5 days  Attendees: Patient: Dakota Williams 09/17/2018 8:54 AM  Physician: Queen Blossom 09/17/2018 8:54 AM  Nursing:  09/17/2018 8:54 AM  RN Care Manager: 09/17/2018 8:54 AM  Social Worker: Stephanie Acre, [place] 09/17/2018 8:54 AM  Recreational Therapist:  09/17/2018 8:54 AM  Other:  09/17/2018 8:54 AM  Other:  09/17/2018 8:54 AM  Other: 09/17/2018 8:54 AM    Scribe for Treatment Team: Joellen [place], [place] 09/17/2018 8:54 AM

## 2018-09-17 NOTE — Tx Team (Signed)
Initial Treatment Plan 09/17/2018 2:47 AM [name] KWI:[id number]    PATIENT STRESSORS: Financial difficulties Marital or family conflict Substance abuse   PATIENT STRENGTHS: Communication skills General fund of knowledge Physical Health   PATIENT IDENTIFIED PROBLEMS: Depression  Suicidal ideation  Substance abuse  "Just stop trying to harm myself"  "Get back on my meds"  "Go to someplace like [facility]"           DISCHARGE CRITERIA:  Improved stabilization in mood, thinking, and/or behavior Motivation to continue treatment in a less acute level of care Reduction of life-threatening or endangering symptoms to within safe limits Verbal commitment to aftercare and medication compliance Withdrawal symptoms are absent or subacute and managed without 24-hour nursing intervention  PRELIMINARY DISCHARGE PLAN: Outpatient therapy Referrals indicated:  Medication management  PATIENT/FAMILY INVOLVEMENT: This treatment plan has been presented to and reviewed with the patient, Dakota Williams.  The patient and family have been given the opportunity to ask questions and make suggestions.  Levin Bacon, RN 09/17/2018, 2:47 AM

## 2018-09-17 NOTE — Plan of Care (Signed)
  Problem: Coping: Goal: Coping ability will improve Outcome: Progressing   Problem: Health Behavior/Discharge Planning: Goal: Identification of resources available to assist in meeting health care needs will improve Outcome: Progressing   Problem: Medication: Goal: Compliance with prescribed medication regimen will improve Outcome: Progressing   Problem: Self-Concept: Goal: Ability to disclose and discuss suicidal ideas will improve Outcome: Progressing Goal: Will verbalize positive feelings about self Outcome: Progressing   

## 2018-09-17 NOTE — Tx Team (Deleted)
Initial Treatment Plan 09/17/2018 1:20 PM MIKAEL MONETTE [id number]    PATIENT STRESSORS: Occupational concerns Substance abuse   PATIENT STRENGTHS: Ability for insight Average or above average intelligence Capable of independent living General fund of knowledge Supportive family/friends   PATIENT IDENTIFIED PROBLEMS: "Drinking"  Job loss  Mother's death                 DISCHARGE CRITERIA:  Ability to meet basic life and health needs Adequate post-discharge living arrangements Improved stabilization in mood, thinking, and/or behavior  PRELIMINARY DISCHARGE PLAN: Outpatient therapy  PATIENT/FAMILY INVOLVEMENT: This treatment plan has been presented to and reviewed with the patient, SHAHZAD WRAGG. The patient and family have been given the opportunity to ask questions and make suggestions.  Dewayne Shorter, RN 09/17/2018, 1:20 PM

## 2018-09-17 NOTE — ED Notes (Signed)
Accepted to [facility]  Room 302-1. Dr Jola Babinski is accepting. Report called to Wynn Banker RN. [facility] called to transport as patient is a vol admit. He is accepting of the admission. All property returned to him. Vol signed to go to [facility].  Cooperative with the process.

## 2018-09-17 NOTE — H&P (Signed)
Psychiatric Admission Assessment Adult  Patient Identification: Dakota Williams MRN:  [id number] Date of Evaluation:  09/17/2018 Chief Complaint:  ETOH induced depressive disorder Bipolar 1 Principal Diagnosis: <principal problem not specified> Diagnosis:  Active Problems:   MDD (major depressive disorder), severe (HCC)  History of Present Illness: Patient is seen and examined.  Patient is a 28 year old male with a past psychiatric history significant for alcohol dependence who presented to the [facility] emergency department on 09/16/2018 with suicidal ideation.  The patient was found via EMS in a [employer] parking lot.  He was in a broken down car.  The patient was unable to fully assess in the field because of altered mental status.  He was unable to recall how he got to the hospital.  On admission in the emergency room his blood alcohol was 177.  He stated that 2 to 3 days prior to this admission he had been at [facility] and had gone through detox.  He was released this previous Friday.  He stated he had psychosocial stressors including a recent break-up with his wife, and probable loss of job on the date of admission.  He stated he noticed that he has alcohol-related issues, but is had difficult time controlling them.  He has not been in any residential or outpatient treatment.  Because of the break-up with a wife he is essentially homeless.  He had recently been diagnosed with bipolar disorder and has a history of depression as well as severe anxiety.  He does have a 50-be restraining order against him because of physical violence toward his wife.  He was admitted to the hospital for evaluation and stabilization.  Associated Signs/Symptoms: Depression Symptoms:  depressed mood, anhedonia, insomnia, psychomotor agitation, fatigue, feelings of worthlessness/guilt, difficulty concentrating, hopelessness, suicidal thoughts without plan, anxiety, loss of  energy/fatigue, disturbed sleep, (Hypo) Manic Symptoms:  Impulsivity, Irritable Mood, Anxiety Symptoms:  Excessive Worry, Psychotic Symptoms:  Denied PTSD Symptoms: Negative Total Time spent with patient: 45 minutes  Past Psychiatric History: He has had multiple emergency room visits for alcohol-related issues.  This is his first psychiatric hospitalization as an adult.  Is the patient at risk to self? Yes.    Has the patient been a risk to self in the past 6 months? No.  Has the patient been a risk to self within the distant past? No.  Is the patient a risk to others? No.  Has the patient been a risk to others in the past 6 months? No.  Has the patient been a risk to others within the distant past? No.   Prior Inpatient Therapy:   Prior Outpatient Therapy:    Alcohol Screening: 1. How often do you have a drink containing alcohol?: 4 or more times a week 2. How many drinks containing alcohol do you have on a typical day when you are drinking?: 5 or 6 3. How often do you have six or more drinks on one occasion?: Monthly AUDIT-C Score: 8 4. How often during the last year have you found that you were not able to stop drinking once you had started?: Less than monthly 5. How often during the last year have you failed to do what was normally expected from you becasue of drinking?: Less than monthly 6. How often during the last year have you needed a first drink in the morning to get yourself going after a heavy drinking session?: Less than monthly 7. How often during the last year have you  had a feeling of guilt of remorse after drinking?: Never 8. How often during the last year have you been unable to remember what happened the night before because you had been drinking?: Less than monthly 9. Have you or someone else been injured as a result of your drinking?: No 10. Has a relative or friend or a doctor or another health worker been concerned about your drinking or suggested you cut down?:  Yes, during the last year Alcohol Use Disorder Identification Test Final Score (AUDIT): 16 Alcohol Brief Interventions/Follow-up: Alcohol Education Substance Abuse History in the last 12 months:  Yes.   Consequences of Substance Abuse: Family Consequences:  Homelessness Previous Psychotropic Medications: No  Psychological Evaluations: No  Past Medical History:  Past Medical History:  Diagnosis Date  . Anxiety   . Asthma   . Bipolar 1 disorder (HCC)   . ETOH abuse    History reviewed. No pertinent surgical history. Family History: History reviewed. No pertinent family history. Family Psychiatric  History: Patient stated that his mother and father both had substance abuse issues. Tobacco Screening: Have you used any form of tobacco in the last 30 days? (Cigarettes, Smokeless Tobacco, Cigars, and/or Pipes): Yes Tobacco use, Select all that apply: cigar use daily Are you interested in Tobacco Cessation Medications?: Yes, will notify MD for an order Counseled patient on smoking cessation including recognizing danger situations, developing coping skills and basic information about quitting provided: Refused/Declined practical counseling Social History:  Social History   Substance and Sexual Activity  Alcohol Use Yes  . Alcohol/week: 8.0 standard drinks  . Types: 2 Cans of beer, 6 Standard drinks or equivalent per week   Comment: fifth 3 days weekly     Social History   Substance and Sexual Activity  Drug Use No    Additional Social History: Marital status: Separated Separated, when?: November 2019 What types of issues is patient dealing with in the relationship?: "We haven't even been married a year." Additional relationship information: "Alcohol played a factor and her famly played a factor in our separation."  Are you sexually active?: Yes What is your sexual orientation?: heterosexual Has your sexual activity been affected by drugs, alcohol, medication, or emotional stress?:  no. Does patient have children?: Yes How many children?: 2 How is patient's relationship with their children?: 2yo boy and 1yo girl--"I haven't seen them recently. They are well taken care of by my wife."                          Allergies:   Allergies  Allergen Reactions  . Naproxen Anaphylaxis  . Penicillins Shortness Of Breath and Swelling    Has patient had a PCN reaction causing immediate rash, facial/tongue/throat swelling, SOB or lightheadedness with hypotension: Yes Has patient had a PCN reaction causing severe rash involving mucus membranes or skin necrosis: No Has patient had a PCN reaction that required hospitalization: Yes Has patient had a PCN reaction occurring within the last 10 years: No If all of the above answers are "NO", then may proceed with Cephalosporin use.  . Shellfish Allergy Anaphylaxis  . Vicodin [Hydrocodone-Acetaminophen] Anaphylaxis   Lab Results:  Results for orders placed or performed during the hospital encounter of 09/16/18 (from the past 48 hour(s))  Comprehensive metabolic panel     Status: Abnormal   Collection Time: 09/16/18 10:06 AM  Result Value Ref Range   Sodium 142 135 - 145 mmol/L   Potassium 4.2 3.5 -  5.1 mmol/L   Chloride 106 98 - 111 mmol/L   CO2 27 22 - 32 mmol/L   Glucose, Bld 84 70 - 99 mg/dL   BUN 11 6 - 20 mg/dL   Creatinine, Ser 1.611.03 0.61 - 1.24 mg/dL   Calcium 8.7 (L) 8.9 - 10.3 mg/dL   Total Protein 7.1 6.5 - 8.1 g/dL   Albumin 4.6 3.5 - 5.0 g/dL   AST 44 (H) 15 - 41 U/L   ALT 30 0 - 44 U/L   Alkaline Phosphatase 48 38 - 126 U/L   Total Bilirubin 0.7 0.3 - 1.2 mg/dL   GFR calc non Af Amer >60 >60 mL/min   GFR calc Af Amer >60 >60 mL/min   Anion gap 9 5 - 15    Comment: Performed at [facility], [address] [place], [place] [address]  Ethanol     Status: Abnormal   Collection Time: 09/16/18 10:06 AM  Result Value Ref Range   Alcohol, Ethyl (B) 171 (H) <10 mg/dL    Comment:  (NOTE) Lowest detectable limit for serum alcohol is 10 mg/dL. For medical purposes only. Performed at [facility], [address] [place], [place] [address]   Salicylate level     Status: None   Collection Time: 09/16/18 10:06 AM  Result Value Ref Range   Salicylate Lvl <7.0 2.8 - 30.0 mg/dL    Comment: Performed at [facility], [address] [place], [place] [address]  Acetaminophen level     Status: Abnormal   Collection Time: 09/16/18 10:06 AM  Result Value Ref Range   Acetaminophen (Tylenol), Serum <10 (L) 10 - 30 ug/mL    Comment: (NOTE) Therapeutic concentrations vary significantly. A range of 10-30 ug/mL  may be an effective concentration for many patients. However, some  are best treated at concentrations outside of this range. Acetaminophen concentrations >150 ug/mL at 4 hours after ingestion  and >50 ug/mL at 12 hours after ingestion are often associated with  toxic reactions. Performed at [facility], [address] [place], [place] [address]   cbc     Status: None   Collection Time: 09/16/18 10:06 AM  Result Value Ref Range   WBC 7.9 4.0 - 10.5 K/uL   RBC 4.94 4.22 - 5.81 MIL/uL   Hemoglobin 15.3 13.0 - 17.0 g/dL   HCT 62.147.1 30.839.0 - 65.752.0 %   MCV 95.3 80.0 - 100.0 fL   MCH 31.0 26.0 - 34.0 pg   MCHC 32.5 30.0 - 36.0 g/dL   RDW 84.611.9 96.211.5 - 95.215.5 %   Platelets 243 150 - 400 K/uL   nRBC 0.0 0.0 - 0.2 %    Comment: Performed at [facility], [address] [place], [place] [address]  Rapid urine drug screen (hospital performed)     Status: Abnormal   Collection Time: 09/16/18  6:31 PM  Result Value Ref Range   Opiates NONE DETECTED NONE DETECTED   Cocaine NONE DETECTED NONE DETECTED   Benzodiazepines POSITIVE (A) NONE DETECTED   Amphetamines NONE DETECTED NONE DETECTED   Tetrahydrocannabinol NONE DETECTED NONE DETECTED   Barbiturates NONE DETECTED NONE DETECTED    Comment:  (NOTE) DRUG SCREEN FOR MEDICAL PURPOSES ONLY.  IF CONFIRMATION IS NEEDED FOR ANY PURPOSE, NOTIFY LAB WITHIN 5 DAYS. LOWEST DETECTABLE LIMITS FOR URINE DRUG SCREEN Drug Class  Cutoff (ng/mL) Amphetamine and metabolites    1000 Barbiturate and metabolites    200 Benzodiazepine                 200 Tricyclics and metabolites     300 Opiates and metabolites        300 Cocaine and metabolites        300 THC                            50 Performed at [facility], [address] [place], [place] [address]     Blood Alcohol level:  Lab Results  Component Value Date   ETH 171 (H) 09/16/2018   ETH 199 (H) 03/24/2018    Metabolic Disorder Labs:  No results found for: HGBA1C, MPG No results found for: PROLACTIN No results found for: CHOL, TRIG, HDL, CHOLHDL, VLDL, LDLCALC  Current Medications: Current Facility-Administered Medications  Medication Dose Route Frequency Provider Last Rate Last Dose  . alum & mag hydroxide-simeth (MAALOX/MYLANTA) 200-200-20 MG/5ML suspension 30 mL  30 mL Oral Q4H PRN Donell Sievert E, PA-C      . gabapentin (NEURONTIN) capsule 300 mg  300 mg Oral TID Antonieta Pert, MD      . hydrOXYzine (ATARAX/VISTARIL) tablet 25 mg  25 mg Oral Q6H PRN Donell Sievert E, PA-C   25 mg at 09/17/18 1208  . loperamide (IMODIUM) capsule 2-4 mg  2-4 mg Oral PRN Kerry Hough, PA-C      . LORazepam (ATIVAN) tablet 1 mg  1 mg Oral Q6H PRN Donell Sievert E, PA-C      . magnesium hydroxide (MILK OF MAGNESIA) suspension 30 mL  30 mL Oral Daily PRN Kerry Hough, PA-C      . multivitamin with minerals tablet 1 tablet  1 tablet Oral Daily Kerry Hough, PA-C   1 tablet at 09/17/18 0829  . ondansetron (ZOFRAN-ODT) disintegrating tablet 4 mg  4 mg Oral Q6H PRN Donell Sievert E, PA-C      . thiamine (B-1) injection 100 mg  100 mg Intramuscular Once Kerry Hough, PA-C      . [START ON 09/18/2018] thiamine (VITAMIN B-1) tablet 100 mg   100 mg Oral Daily Simon, Spencer E, PA-C      . traZODone (DESYREL) tablet 50 mg  50 mg Oral QHS,MR X 1 Simon, Spencer E, PA-C       PTA Medications: No medications prior to admission.    Musculoskeletal: Strength & Muscle Tone: within normal limits Gait & Station: normal Patient leans: N/A  Psychiatric Specialty Exam: Physical Exam  Nursing note and vitals reviewed. Constitutional: He is oriented to person, place, and time. He appears well-developed and well-nourished.  HENT:  Head: Normocephalic and atraumatic.  Respiratory: Effort normal.  Neurological: He is alert and oriented to person, place, and time.    ROS  Blood pressure 101/72, pulse 81, temperature 98.7 F (37.1 C), temperature source Oral, resp. rate 18, height 6\' 2"  (1.88 m), weight 75.8 kg.Body mass index is 21.44 kg/m.  General Appearance: Disheveled  Eye Contact:  Fair  Speech:  Normal Rate  Volume:  Normal  Mood:  Depressed  Affect:  Congruent  Thought Process:  Coherent and Descriptions of Associations: Intact  Orientation:  Full (Time, Place, and Person)  Thought Content:  Logical  Suicidal Thoughts:  Yes.  without intent/plan  Homicidal Thoughts:  No  Memory:  Immediate;  Fair Recent;   Fair Remote;   Fair  Judgement:  Impaired  Insight:  Lacking  Psychomotor Activity:  Psychomotor Retardation  Concentration:  Concentration: Fair and Attention Span: Fair  Recall:  [employer] of Knowledge:  Fair  Language:  Fair  Akathisia:  Negative  Handed:  Right  AIMS (if indicated):     Assets:  Desire for Improvement Physical Health Resilience  ADL's:  Intact  Cognition:  WNL  Sleep:       Treatment Plan Summary: Daily contact with patient to assess and evaluate symptoms and progress in treatment, Medication management and Plan : Patient is seen and examined.  Patient is a 28 year old male with the above-stated past psychiatric history who was admitted secondary to suicidal ideation.  He does have  alcohol dependence, and realizes alcohol is his main problem.  He was just in detox the other day, so I do not think that he requires detox completely.  He is attempting to get into a residential treatment program.  He will be admitted to the hospital.  He will be integrated into the milieu.  He will be encouraged to attend groups.  We will monitor for withdrawal symptoms.  He will be placed on lorazepam 1 mg every 6 hours PRN CIWA greater than 10.  He will receive multivitamins, thiamine and folic acid.  He also complains of some pain issues, and allergies to certain pain medicines.  We will write for tramadol 50 mg p.o. every 6 hours as needed pain.  Review of his laboratories reveal a mildly elevated AST at 44, normal MCV, blood alcohol on admission of 171, and benzodiazepines in his drug screen.  His vital signs are currently stable.  Observation Level/Precautions:  Detox 15 minute checks  Laboratory:  Chemistry Profile  Psychotherapy:    Medications:    Consultations:    Discharge Concerns:    Estimated LOS:  Other:     Physician Treatment Plan for Primary Diagnosis: <principal problem not specified> Long Term Goal(s): Improvement in symptoms so as ready for discharge  Short Term Goals: Ability to identify changes in lifestyle to reduce recurrence of condition will improve, Ability to verbalize feelings will improve, Ability to disclose and discuss suicidal ideas, Ability to demonstrate self-control will improve, Ability to identify and develop effective coping behaviors will improve, Ability to maintain clinical measurements within normal limits will improve and Ability to identify triggers associated with substance abuse/mental health issues will improve  Physician Treatment Plan for Secondary Diagnosis: Active Problems:   MDD (major depressive disorder), severe (HCC)  Long Term Goal(s): Improvement in symptoms so as ready for discharge  Short Term Goals: Ability to identify changes in  lifestyle to reduce recurrence of condition will improve, Ability to verbalize feelings will improve, Ability to disclose and discuss suicidal ideas, Ability to demonstrate self-control will improve, Ability to identify and develop effective coping behaviors will improve, Ability to maintain clinical measurements within normal limits will improve and Ability to identify triggers associated with substance abuse/mental health issues will improve  I certify that inpatient services furnished can reasonably be expected to improve the patient's condition.    Antonieta Pert, MD 1/29/20202:14 PM

## 2018-09-17 NOTE — ED Notes (Signed)
T/C from [name] at [facility] to let staff know he has been accepted to [facility] bed 302 bed 2. Dr Jola Babinski is accepting. Will call report soon.

## 2018-09-17 NOTE — [facility] Suicide Risk Assessment (Signed)
[facility] INPATIENT:  Family/Significant Other Suicide Prevention Education  Suicide Prevention Education:  Education Completed; Norton Pastel (pt's wife) [phone] has been identified by the patient as the family member/significant other with whom the patient will be residing, and identified as the person(s) who will aid the patient in the event of a mental health crisis (suicidal ideations/suicide attempt).  With written consent from the patient, the family member/significant other has been provided the following suicide prevention education, prior to the and/or following the discharge of the patient.  The suicide prevention education provided includes the following:  Suicide risk factors  Suicide prevention and interventions  National Suicide Hotline telephone number  [facility] assessment telephone number  [facility] Emergency Assistance 911  [facility] and/or Residential Mobile Crisis Unit telephone number  Request made of family/significant other to:  Remove weapons (e.g., guns, rifles, knives), all items previously/currently identified as safety concern.    Remove drugs/medications (over-the-counter, prescriptions, illicit drugs), all items previously/currently identified as a safety concern.  The family member/significant other verbalizes understanding of the suicide prevention education information provided.  The family member/significant other agrees to remove the items of safety concern listed above.  SPE and aftercare reviewed with pt's wife. Pt's wife confirmed that pt DOES NOT have access to weapons or firearms. She hopes that we are able to hold patient until he goes to [facility], and fears that he may relapse if discharged prior to this.   Rona Ravens LCSW 09/17/2018, 12:45 PM
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
## 2018-09-17 NOTE — Progress Notes (Addendum)
Recreation Therapy Notes  Date:  1.29.20 Time: 0930 Location: [address] Dayroom  Group Topic: Stress Management  Goal Area(s) Addresses:  Patient will identify positive stress management techniques. Patient will identify benefits of using stress management post d/c.  Behavioral Response:  Engaged  Intervention:  Stress Management  Activity :   Mountain Meditation.  LRT introduced the stress management technique of meditation.  Patients were to envision a mountain and visualize taking on the characteristics of that mountain.    Education:  Stress Management, Discharge Planning.   Education Outcome: Acknowledges Education  Clinical Observations/Feedback: Pt attended group.    Carsin Randazzo, LRT/CTRS         Antiono Ettinger A 09/17/2018 10:50 AM 

## 2018-09-17 NOTE — Therapy (Signed)
Occupational Therapy Group Note  Date:  09/17/2018 Time:  4:43 PM  Group Topic/Focus:  Stress Management  Participation Level:  Active  Participation Quality:  Appropriate  Affect:  Flat  Cognitive:  Appropriate  Insight: Improving  Engagement in Group:  Engaged  Modes of Intervention:  Activity, Discussion, Education and Socialization  Additional Comments:    S: "I get stressed with my wife"  O: Education given on stress management. Ice breaker given at beginning of session to help engage pt. Pt challenged to name healthy coping mechanisms to manage stress vs. poor coping mechanisms used to manage stress. Pt asked to list consequences involved with poor coping mechanisms. At end of session pt to name one preferred healthy coping mechanism.  A: Pt presents to group with flat affect, engaged and participatory. Pt actively engaged in ice breaker, noting increased affect. Pt shares that his common poor coping mechanism is suppression and he will "explode" on his in-laws. Pt shares that he will enjoy deep breathing and music when he needs to manage stress. Also discussed anger management strategies with pt about getting self out of situation and improving communication prior to anger outbursts.  P: OT group will continue once per week while pt inpatient.   Dalphine Handing, MSOT, OTR/L Behavioral Health OT/ Acute Relief OT PHP Office: [phone]  Dalphine Handing 09/17/2018, 4:43 PM

## 2018-09-18 MED ORDER — GABAPENTIN 400 MG PO CAPS
400.0000 mg | ORAL_CAPSULE | Freq: Three times a day (TID) | ORAL | Status: DC
  Administered 2018-09-18 – 2018-09-21 (×11): 400 mg via ORAL
  Filled 2018-09-18 (×2): qty 1
  Filled 2018-09-18: qty 42
  Filled 2018-09-18: qty 1
  Filled 2018-09-18: qty 42
  Filled 2018-09-18: qty 1
  Filled 2018-09-18 (×2): qty 42
  Filled 2018-09-18: qty 1
  Filled 2018-09-18: qty 42
  Filled 2018-09-18 (×5): qty 1
  Filled 2018-09-18: qty 42
  Filled 2018-09-18 (×3): qty 1

## 2018-09-18 MED ORDER — TRAMADOL HCL 50 MG PO TABS
100.0000 mg | ORAL_TABLET | Freq: Two times a day (BID) | ORAL | Status: DC | PRN
  Administered 2018-09-18 – 2018-09-22 (×9): 100 mg via ORAL
  Filled 2018-09-18 (×9): qty 2

## 2018-09-18 MED ORDER — FOLIC ACID 1 MG PO TABS
1.0000 mg | ORAL_TABLET | Freq: Every day | ORAL | Status: DC
  Administered 2018-09-18 – 2018-09-21 (×4): 1 mg via ORAL
  Filled 2018-09-18 (×7): qty 1

## 2018-09-18 NOTE — Progress Notes (Signed)
BHH Group Notes:  (Nursing/MHT/Case Management/Adjunct)  Date:  09/18/2018  Time:  2045  Type of Therapy:  wrap up group  Participation Level:  Active  Participation Quality:  Appropriate, Attentive, Sharing and Supportive  Affect:  Appropriate  Cognitive:  Appropriate  Insight:  Improving  Engagement in Group:  Engaged  Modes of Intervention:  Clarification, Education and Support  Summary of Progress/Problems: Pt shared that he and his wife communicated today and he feels that the marriage will be salvaged. He thinks they were able to have a conversation and make plans forward because he is finally sober. If patient could change any one thing about his life it would be to move his wife and kids away from his wife's family. Pt is grateful for his wife and the fact that his children are only 74 and 15 years old and will not remember this.   Marcille Buffy 09/18/2018, 9:47 PM

## 2018-09-18 NOTE — Plan of Care (Addendum)
Patient was pleasant but anxious with a flat affect this morning. Patient said he did not sleep well last night due to leg pain. He said his legs feel as if they're being crushed underneath him. MD notified. Patient denies SI HI AVH.  Patient is compliant with medications prescribed by provider. No side effects noted. Safety is maintained with 15 minute checks as well as environmental checks. Will continue to monitor and provide support.  Problem: Education: Goal: Ability to make informed decisions regarding treatment will improve Outcome: Progressing   Problem: Coping: Goal: Coping ability will improve Outcome: Progressing   Problem: Health Behavior/Discharge Planning: Goal: Identification of resources available to assist in meeting health care needs will improve Outcome: Progressing   Problem: Medication: Goal: Compliance with prescribed medication regimen will improve Outcome: Progressing

## 2018-09-18 NOTE — Progress Notes (Signed)
[facility] MD Progress Note  09/18/2018 2:38 PM Dakota Williams  MRN:  [id number] Subjective: Patient is seen and examined.  Patient is a 28 year old male with a past psychiatric history significant for alcohol dependence who presented to the [facility] emergency department on 09/16/2018 with suicidal ideation.  Objective: Patient is seen and examined.  Patient's 28 year old male with the above-stated past psychiatric history who is seen in follow-up.  He is significantly irritable this morning.  He "wants to know why my pain medicine was stopped".  I informed the patient that the nurses came to me after he had received the tramadol and said that "it was making my pain worse".  I told him I had stopped it at that time, and then put the gabapentin on board because of his multiple allergies to pain medications.  After long discussion he realized that much of this irritability was secondary to his alcohol withdrawal.  He was able to calm himself, and then became quite tearful during the interview.  We discussed his continuing desire to get into a residential treatment program, and there is disruption in his personal life with regard to his relationship with his wife, his job and his children had suffered because of his alcoholism.  His vital signs are stable, and he remains afebrile.  Principal Problem: <principal problem not specified> Diagnosis: Active Problems:   MDD (major depressive disorder), severe (HCC)  Total Time spent with patient: 30 minutes  Past Psychiatric History: See admission H&P  Past Medical History:  Past Medical History:  Diagnosis Date  . Anxiety   . Asthma   . Bipolar 1 disorder (HCC)   . ETOH abuse    History reviewed. No pertinent surgical history. Family History: History reviewed. No pertinent family history. Family Psychiatric  History: See admission H&P Social History:  Social History   Substance and Sexual Activity  Alcohol Use Yes  .  Alcohol/week: 8.0 standard drinks  . Types: 2 Cans of beer, 6 Standard drinks or equivalent per week   Comment: fifth 3 days weekly     Social History   Substance and Sexual Activity  Drug Use No    Social History   Socioeconomic History  . Marital status: Single    Spouse name: Not on file  . Number of children: Not on file  . Years of education: Not on file  . Highest education level: Not on file  Occupational History  . Not on file  Social Needs  . Financial resource strain: Not on file  . Food insecurity:    Worry: Not on file    Inability: Not on file  . Transportation needs:    Medical: Not on file    Non-medical: Not on file  Tobacco Use  . Smoking status: Current Every Day Smoker    Packs/day: 0.50  . Smokeless tobacco: Never Used  Substance and Sexual Activity  . Alcohol use: Yes    Alcohol/week: 8.0 standard drinks    Types: 2 Cans of beer, 6 Standard drinks or equivalent per week    Comment: fifth 3 days weekly  . Drug use: No  . Sexual activity: Not on file  Lifestyle  . Physical activity:    Days per week: Not on file    Minutes per session: Not on file  . Stress: Not on file  Relationships  . Social connections:    Talks on phone: Not on file    Gets together: Not on file  Attends religious service: Not on file    Active member of club or organization: Not on file    Attends meetings of clubs or organizations: Not on file    Relationship status: Not on file  Other Topics Concern  . Not on file  Social History Narrative  . Not on file   Additional Social History:                         Sleep: Fair  Appetite:  Good  Current Medications: Current Facility-Administered Medications  Medication Dose Route Frequency Provider Last Rate Last Dose  . alum & mag hydroxide-simeth (MAALOX/MYLANTA) 200-200-20 MG/5ML suspension 30 mL  30 mL Oral Q4H PRN Donell SievertSimon, Spencer E, PA-C      . gabapentin (NEURONTIN) capsule 400 mg  400 mg Oral TID  Antonieta Pertlary, Greg Lawson, MD   400 mg at 09/18/18 1206  . hydrOXYzine (ATARAX/VISTARIL) tablet 25 mg  25 mg Oral Q6H PRN Donell SievertSimon, Spencer E, PA-C   25 mg at 09/18/18 0805  . loperamide (IMODIUM) capsule 2-4 mg  2-4 mg Oral PRN Kerry HoughSimon, Spencer E, PA-C      . LORazepam (ATIVAN) tablet 1 mg  1 mg Oral Q6H PRN Donell SievertSimon, Spencer E, PA-C      . magnesium hydroxide (MILK OF MAGNESIA) suspension 30 mL  30 mL Oral Daily PRN Kerry HoughSimon, Spencer E, PA-C      . multivitamin with minerals tablet 1 tablet  1 tablet Oral Daily Kerry HoughSimon, Spencer E, PA-C   1 tablet at 09/18/18 0804  . ondansetron (ZOFRAN-ODT) disintegrating tablet 4 mg  4 mg Oral Q6H PRN Donell SievertSimon, Spencer E, PA-C      . thiamine (B-1) injection 100 mg  100 mg Intramuscular Once Donell SievertSimon, Spencer E, PA-C      . thiamine (VITAMIN B-1) tablet 100 mg  100 mg Oral Daily Donell SievertSimon, Spencer E, PA-C   100 mg at 09/18/18 0804  . traMADol (ULTRAM) tablet 100 mg  100 mg Oral Q12H PRN Antonieta Pertlary, Greg Lawson, MD   100 mg at 09/18/18 [id number]  . traZODone (DESYREL) tablet 50 mg  50 mg Oral QHS,MR X 1 Kerry HoughSimon, Spencer E, PA-C   50 mg at 09/17/18 2222    Lab Results:  Results for orders placed or performed during the hospital encounter of 09/16/18 (from the past 48 hour(s))  Rapid urine drug screen (hospital performed)     Status: Abnormal   Collection Time: 09/16/18  6:31 PM  Result Value Ref Range   Opiates NONE DETECTED NONE DETECTED   Cocaine NONE DETECTED NONE DETECTED   Benzodiazepines POSITIVE (A) NONE DETECTED   Amphetamines NONE DETECTED NONE DETECTED   Tetrahydrocannabinol NONE DETECTED NONE DETECTED   Barbiturates NONE DETECTED NONE DETECTED    Comment: (NOTE) DRUG SCREEN FOR MEDICAL PURPOSES ONLY.  IF CONFIRMATION IS NEEDED FOR ANY PURPOSE, NOTIFY LAB WITHIN 5 DAYS. LOWEST DETECTABLE LIMITS FOR URINE DRUG SCREEN Drug Class                     Cutoff (ng/mL) Amphetamine and metabolites    1000 Barbiturate and metabolites    200 Benzodiazepine                 200 Tricyclics  and metabolites     300 Opiates and metabolites        300 Cocaine and metabolites        300 THC  50 Performed at [facility], [address] [place], [place] [address]     Blood Alcohol level:  Lab Results  Component Value Date   ETH 171 (H) 09/16/2018   ETH 199 (H) 03/24/2018    Metabolic Disorder Labs: No results found for: HGBA1C, MPG No results found for: PROLACTIN No results found for: CHOL, TRIG, HDL, CHOLHDL, VLDL, LDLCALC  Physical Findings: AIMS: Facial and Oral Movements Muscles of Facial Expression: None, normal Lips and Perioral Area: None, normal Jaw: None, normal Tongue: None, normal,Extremity Movements Upper (arms, wrists, hands, fingers): None, normal Lower (legs, knees, ankles, toes): None, normal, Trunk Movements Neck, shoulders, hips: None, normal, Overall Severity Severity of abnormal movements (highest score from questions above): None, normal Incapacitation due to abnormal movements: None, normal Patient's awareness of abnormal movements (rate only patient's report): No Awareness, Dental Status Current problems with teeth and/or dentures?: No Does patient usually wear dentures?: No  CIWA:  CIWA-Ar Total: 4 COWS:  COWS Total Score: 3  Musculoskeletal: Strength & Muscle Tone: within normal limits Gait & Station: normal Patient leans: N/A  Psychiatric Specialty Exam: Physical Exam  Nursing note and vitals reviewed. Constitutional: He is oriented to person, place, and time. He appears well-developed and well-nourished.  HENT:  Head: Normocephalic and atraumatic.  Respiratory: Effort normal.  Neurological: He is alert and oriented to person, place, and time.    ROS  Blood pressure 111/75, pulse 89, temperature 98.7 F (37.1 C), temperature source Oral, resp. rate 16, height 6\' 2"  (1.88 m), weight 75.8 kg.Body mass index is 21.44 kg/m.  General Appearance: Casual  Eye Contact:  Fair  Speech:   Normal Rate  Volume:  Normal  Mood:  Anxious and Irritable  Affect:  Congruent  Thought Process:  Coherent and Descriptions of Associations: Intact  Orientation:  Full (Time, Place, and Person)  Thought Content:  Logical  Suicidal Thoughts:  No  Homicidal Thoughts:  No  Memory:  Immediate;   Fair Recent;   Fair Remote;   Fair  Judgement:  Intact  Insight:  Fair  Psychomotor Activity:  Increased  Concentration:  Concentration: Fair and Attention Span: Fair  Recall:  [employer] of Knowledge:  Fair  Language:  Fair  Akathisia:  Negative  Handed:  Right  AIMS (if indicated):     Assets:  Communication Skills Desire for Improvement Physical Health Resilience  ADL's:  Intact  Cognition:  WNL  Sleep:        Treatment Plan Summary: Daily contact with patient to assess and evaluate symptoms and progress in treatment, Medication management and Plan : Patient is seen and examined.  Patient is a 28 year old male with the above-stated past psychiatric history is seen in follow-up.  Despite irritability he is doing little bit better today.  I will restart the tramadol at 100 mg p.o. every 12 hours as needed pain, and I will increase his gabapentin to 400 mg p.o. 3 times daily.  No change in the folic acid or thiamine.  He continues on a CIWA scale greater than 10 with 1 mg of Ativan.  No other changes to his medications.  Hopefully we will find out about residential treatment shortly. 1.  Continue lorazepam 1 mg p.o. every 6 hours PRN CIWA greater than 10. 2.  Increase gabapentin to 400 mg p.o. 3 times daily for pain and anxiety. 3.  Continue hydroxyzine 25 mg p.o. every 6 hours as needed anxiety. 4.  Continue thiamine 100 mg p.o.  daily for nutritional supplementation. 5.  Continue folic acid 1 mg p.o. daily for nutritional supplementation. 6.  Restart tramadol to 100 mg p.o. every 12 hours PRN pain. 7.  Continue trazodone 50 mg p.o. nightly as needed insomnia. 8.  Disposition planning-in  progress.  Antonieta Pert, MD 09/18/2018, 2:38 PM

## 2018-09-18 NOTE — Progress Notes (Signed)
Pt in room with wife visiting.  Pt sts relationship is good but required to be away from wife while she completes her program.  Pt sts he is bad influence on her.  Pts plan is stay at [facility], halfway house and a job.  Pt sts he has leg pain.  Pt advised that he cannot have tylenol as chart sts he is allergic.  Pt sts he is not allergic.   Pt given meds and prn meds as prescribed. Pt remains safe on unit.

## 2018-09-18 NOTE — [facility] Group Notes (Signed)
LCSW Group Therapy Note  09/18/2018 1:15pm  Type of Therapy/Topic:  Group Therapy:  Feelings about Diagnosis  Participation Level:  Active   Description of Group:   This group will allow patients to explore their thoughts and feelings about diagnoses they have received. Patients will be guided to explore their level of understanding and acceptance of these diagnoses. Facilitator will encourage patients to process their thoughts and feelings about the reactions of others to their diagnosis and will guide patients in identifying ways to discuss their diagnosis with significant others in their lives. This group will be process-oriented, with patients participating in exploration of their own experiences, giving and receiving support, and processing challenge from other group members.   Therapeutic Goals: 1. Patient will demonstrate understanding of diagnosis as evidenced by identifying two or more symptoms of the disorder 2. Patient will be able to express two feelings regarding the diagnosis 3. Patient will demonstrate their ability to communicate their needs through discussion and/or role play  Summary of Patient Progress:  Racyn was attentive and engaged during today's processing group. He shared that he understands his diagnosis and thinks that he will benefit from ongoing medication management and "at least 30 days at [facility]." Pt looking forward to [facility] admission and is hoping to discharge there on Monday.   Therapeutic Modalities:   Cognitive Behavioral Therapy Brief Therapy Feelings Identification    Rona Ravens, LCSW

## 2018-09-18 NOTE — Progress Notes (Signed)
D.  Pt pleasant on approach, denies complaints at this time.  Pt states his wife is mad at him for being here but that he has been a good provider in the past.  Pt was positive for evening wrap up group, observed engaged in appropriate interaction with peers on the unit.  Pt denies SI/HI/AVH at this time.  States his goal is to "get straight" so he can take care of his family.  A.  Support and encouragement offered,praise at coming in for help and recognizing that he needed to.  Medication given as ordered  R.  Pt remains safe on the unit, will continue to monitor.

## 2018-09-19 MED ORDER — TRAZODONE HCL 100 MG PO TABS
100.0000 mg | ORAL_TABLET | Freq: Every evening | ORAL | Status: DC | PRN
  Administered 2018-09-19 – 2018-09-21 (×5): 100 mg via ORAL
  Filled 2018-09-19: qty 28
  Filled 2018-09-19 (×6): qty 1
  Filled 2018-09-19: qty 28
  Filled 2018-09-19: qty 1
  Filled 2018-09-19 (×2): qty 28
  Filled 2018-09-19: qty 1

## 2018-09-19 NOTE — Progress Notes (Signed)
Pt attended AA group this evening.  

## 2018-09-19 NOTE — Progress Notes (Signed)
  [facility] Adult Case Management Discharge Plan :  Will you be returning to the same living situation after discharge:  No. Pt plans to walk in to [facility] Monday 2/3 morning for screening and possible admission.  At discharge, do you have transportation home?: Yes,  taxi voucher in patient chart. HE MUST BE DISCHARGED NO LATER THAN 7AM ON MONDAY 2/3 IN ORDER TO GET TO [facility] FOR SCREENING. Do you have the ability to pay for your medications: Yes,  mental health  Release of information consent forms completed and submitted to medical records by CSW.   Patient to Follow up at: Follow-up Information    Services, [facility] Follow up on 09/29/2018.   Why:  Screening for possible admission on Monday, 2/10 at 7:45AM. Per June, you may also walk in on Monday 2/3 at 7:45AM to be screened for possible admission. Please bring: 14 day supply of medications, proof of [employer] residency, and clothing.  Contact information: [address] [place] [address] [phone]        [facility] Follow up.   Specialty:  Behavioral Health Why:  Please walk in within 3 days of hospital/residential treatment discharge to be assessed for outpatient mental health services including: Medication Management and Therapy. Walk in hours: Mon-Fri 8:00AM-10:00AM. Thank you.  Contact information: [address] [place] [address] [phone]         *Pt will also require a 14 day supply of all prescribed medications and 30 prescription for [facility]--treatment team notified.   Next level of care provider has access to [facility] Link:no  Safety Planning and Suicide Prevention discussed: Yes,  SPE completed with pt and pt's wife. SPI pamphlet and mobile crisis information provided.  Have you used any form of tobacco in the last 30 days? (Cigarettes, Smokeless Tobacco, Cigars, and/or Pipes): Yes  Has patient been referred to the Quitline?: Patient refused referral  Patient has been referred for  addiction treatment: Yes  Rona Ravens, LCSW 09/19/2018, 9:21 AM

## 2018-09-19 NOTE — Plan of Care (Signed)
°  Problem: Education: °Goal: Ability to make informed decisions regarding treatment will improve °Outcome: Progressing °  °Problem: Coping: °Goal: Coping ability will improve °Outcome: Progressing °  °

## 2018-09-19 NOTE — [facility] Group Notes (Signed)
LCSW Group Therapy Note  09/19/2018 11:38 AM  Type of Therapy and Topic: Group Therapy: Avoiding Self-Sabotaging and Enabling Behaviors  Participation Level: Active  Description of Group:  In this group, patients will learn how to identify obstacles, self-sabotaging and enabling behaviors, as well as: what are they, why do we do them and what needs these behaviors meet. Discuss unhealthy relationships and how to have positive healthy boundaries with those that sabotage and enable. Explore aspects of self-sabotage and enabling in yourself and how to limit these self-destructive behaviors in everyday life.  Therapeutic Goals: 1. Patient will identify one obstacle that relates to self-sabotage and enabling behaviors 2. Patient will identify one personal self-sabotaging or enabling behavior they did prior to admission 3. Patient will state a plan to change the above identified behavior 4. Patient will demonstrate ability to communicate their needs through discussion and/or role play.   Summary of Patient Progress:  Patient appropriately listened and participated in group. Patient identified toxic relationships and substance use as self sabotaging behaviors.     Therapeutic Modalities:  Cognitive Behavioral Therapy Person-Centered Therapy Motivational Interviewing  Enid Cutter, MSW, [employer] Clinical Social Worker

## 2018-09-19 NOTE — Progress Notes (Signed)
Recreation Therapy Notes  Date:  1.31.20 Time: 0930 Location: [address] Dayroom  Group Topic: Stress Management  Goal Area(s) Addresses:  Patient will identify positive stress management techniques. Patient will identify benefits of using stress management post d/c.  Behavioral Response: Engaged  Intervention: Stress Management  Activity :  Progressive Muscle Relaxation.  LRT introduced the stress management technique of progressive muscle relaxation.  LRT read a script that guided patients through the process of tensing and relaxing each muscle group individually.  Patients were to follow along as script was read to engage in activity.  Education:  Stress Management, Discharge Planning.   Education Outcome: Acknowledges Education  Clinical Observations/Feedback:  Pt attended and participated in activity.     Caroll Rancher, LRT/CTRS         Lillia Abed, Toini Failla A 09/19/2018 11:03 AM

## 2018-09-19 NOTE — Progress Notes (Signed)
[facility]  09/19/2018 11:21 AM Frances NickelsSteven M Dufford  MRN:  [id number] Subjective: Patient is seen and examined.  Patient is a 28 year old male with a past psychiatric history significant for alcohol dependence who [facility] on 09/16/2018 with suicidal ideation.  Objective: Patient is seen and examined.  Patient is a 28 year old male with the above-stated past psychiatric history who is seen in follow-up.  He is doing much better today.  His irritability has decreased significantly.  He stated that [facility] is [facility], and if not able to get in on Monday he will go stay with a cousin and present himself every day until they have a bed available.  It sounds like he has been accepted and that should not be a problem.  He is requesting excuse letters from work given his hospitalization.  He stated his pain was doing better, his withdrawal symptoms had improved.  He denied any suicidal ideation.  He spoke to his wife and is trying to make amends for what he is done while he was intoxicated.  His vital signs are stable, he is afebrile.  He slept 5 hours last night.  Principal Problem: <principal problem not specified> Diagnosis: Active Problems:   MDD (major depressive disorder), severe (HCC)  Total Time spent with patient: 15 minutes  Past Psychiatric History: See admission H&P  Past Medical History:  Past Medical History:  Diagnosis Date  . Anxiety   . Asthma   . Bipolar 1 disorder (HCC)   . ETOH abuse    History reviewed. No pertinent surgical history. Family History: History reviewed. No pertinent family history. Family Psychiatric  History: See admission H&P Social History:  Social History   Substance and Sexual Activity  Alcohol Use Yes  . Alcohol/week: 8.0 standard drinks  . Types: 2 Cans of beer, 6 Standard drinks or equivalent per week   Comment: fifth 3 days weekly     Social History    Substance and Sexual Activity  Drug Use No    Social History   Socioeconomic History  . Marital status: Single    Spouse name: Not on file  . Number of children: Not on file  . Years of education: Not on file  . Highest education level: Not on file  Occupational History  . Not on file  Social Needs  . Financial resource strain: Not on file  . Food insecurity:    Worry: Not on file    Inability: Not on file  . Transportation needs:    Medical: Not on file    Non-medical: Not on file  Tobacco Use  . Smoking status: Current Every Day Smoker    Packs/day: 0.50  . Smokeless tobacco: Never Used  Substance and Sexual Activity  . Alcohol use: Yes    Alcohol/week: 8.0 standard drinks    Types: 2 Cans of beer, 6 Standard drinks or equivalent per week    Comment: fifth 3 days weekly  . Drug use: No  . Sexual activity: Not on file  Lifestyle  . Physical activity:    Days per week: Not on file    Minutes per session: Not on file  . Stress: Not on file  Relationships  . Social connections:    Talks on phone: Not on file    Gets together: Not on file    Attends religious service: Not on file    Active member of  club or organization: Not on file    Attends meetings of clubs or organizations: Not on file    Relationship status: Not on file  Other Topics Concern  . Not on file  Social History Narrative  . Not on file   Additional Social History:                         Sleep: Fair  Appetite:  Fair  Current Medications: Current Facility-Administered Medications  Medication Dose Route Frequency Provider Last Rate Last Dose  . alum & mag hydroxide-simeth (MAALOX/MYLANTA) 200-200-20 MG/5ML suspension 30 mL  30 mL Oral Q4H PRN Donell Sievert E, PA-C      . folic acid (FOLVITE) tablet 1 mg  1 mg Oral Daily Antonieta Pert, MD   1 mg at 09/19/18 1610  . gabapentin (NEURONTIN) capsule 400 mg  400 mg Oral TID Antonieta Pert, MD   400 mg at 09/19/18 0804  .  hydrOXYzine (ATARAX/VISTARIL) tablet 25 mg  25 mg Oral Q6H PRN Kerry Hough, PA-C   25 mg at 09/19/18 0556  . loperamide (IMODIUM) capsule 2-4 mg  2-4 mg Oral PRN Kerry Hough, PA-C      . LORazepam (ATIVAN) tablet 1 mg  1 mg Oral Q6H PRN Donell Sievert E, PA-C      . magnesium hydroxide (MILK OF MAGNESIA) suspension 30 mL  30 mL Oral Daily PRN Kerry Hough, PA-C      . multivitamin with minerals tablet 1 tablet  1 tablet Oral Daily Kerry Hough, PA-C   1 tablet at 09/19/18 9604  . ondansetron (ZOFRAN-ODT) disintegrating tablet 4 mg  4 mg Oral Q6H PRN Donell Sievert E, PA-C      . thiamine (B-1) injection 100 mg  100 mg Intramuscular Once Donell Sievert E, PA-C      . thiamine (VITAMIN B-1) tablet 100 mg  100 mg Oral Daily Donell Sievert E, PA-C   100 mg at 09/19/18 0803  . traMADol (ULTRAM) tablet 100 mg  100 mg Oral Q12H PRN Antonieta Pert, MD   100 mg at 09/19/18 5409  . traZODone (DESYREL) tablet 50 mg  50 mg Oral QHS,MR X 1 Kerry Hough, PA-C   50 mg at 09/18/18 2232    Lab Results: No results found for this or any previous visit (from the past 48 hour(s)).  Blood Alcohol level:  Lab Results  Component Value Date   ETH 171 (H) 09/16/2018   ETH 199 (H) 03/24/2018    Metabolic Disorder Labs: No results found for: HGBA1C, MPG No results found for: PROLACTIN No results found for: CHOL, TRIG, HDL, CHOLHDL, VLDL, LDLCALC  Physical Findings: AIMS: Facial and Oral Movements Muscles of Facial Expression: None, normal Lips and Perioral Area: None, normal Jaw: None, normal Tongue: None, normal,Extremity Movements Upper (arms, wrists, hands, fingers): None, normal Lower (legs, knees, ankles, toes): None, normal, Trunk Movements Neck, shoulders, hips: None, normal, Overall Severity Severity of abnormal movements (highest score from questions above): None, normal Incapacitation due to abnormal movements: None, normal Patient's awareness of abnormal movements (rate  only patient's report): No Awareness, Dental Status Current problems with teeth and/or dentures?: No Does patient usually wear dentures?: No  CIWA:  CIWA-Ar Total: 2 COWS:  COWS Total Score: 3  Musculoskeletal: Strength & Muscle Tone: within normal limits Gait & Station: normal Patient leans: N/A  Psychiatric Specialty Exam: Physical Exam  Nursing note and vitals  reviewed. Constitutional: He is oriented to person, place, and time. He appears well-developed and well-nourished.  HENT:  Head: Normocephalic and atraumatic.  Respiratory: Effort normal.  Neurological: He is alert and oriented to person, place, and time.    ROS  Blood pressure 139/89, pulse 67, temperature 97.7 F (36.5 C), temperature source Oral, resp. rate 16, height 6\' 2"  (1.88 m), weight 75.8 kg.Body mass index is 21.44 kg/m.  General Appearance: Casual  Eye Contact:  Fair  Speech:  Normal Rate  Volume:  Normal  Mood:  Anxious  Affect:  Congruent  Thought Process:  Coherent and Descriptions of Associations: Intact  Orientation:  Full (Time, Place, and Person)  Thought Content:  Logical  Suicidal Thoughts:  No  Homicidal Thoughts:  No  Memory:  Immediate;   Fair Recent;   Fair Remote;   Fair  Judgement:  Intact  Insight:  Fair  Psychomotor Activity:  Normal  Concentration:  Concentration: Fair and Attention Span: Fair  Recall:  [employer] of Knowledge:  Fair  Language:  Fair  Akathisia:  Negative  Handed:  Right  AIMS (if indicated):     Assets:  Communication Skills Desire for Improvement Physical Health Resilience Social Support  ADL's:  Intact  Cognition:  WNL  Sleep:  Number of Hours: 5     Treatment Plan Summary: Daily contact with patient to assess and evaluate symptoms and progress in treatment, Medication management and Plan : Patient is seen and examined.  Patient is a 28 year old male with the above-stated past psychiatric history who is seen in follow-up.  He is doing better today.   His irritability has decreased.  His vital signs are stable.  He is voicing no suicidal ideation.  He is supposed to go for residential treatment on Monday, and will make arrangements for that.  The only issue right now has to do with his sleep.  I will increase his trazodone 200 mg p.o. nightly as needed insomnia.  Otherwise no other changes in his medications including lorazepam, gabapentin, hydroxyzine, folic acid, thiamine or tramadol.  He did state that his pain in his legs was doing better today with the gabapentin as well as the tramadol. 1.  Continue folic acid 1 mg p.o. daily for nutritional supplementation. 2.  Continue gabapentin 400 mg p.o. 3 times daily for pain as well as mood stability and anxiety. 3.  Continue hydroxyzine 25 mg p.o. every 6 hours as needed anxiety. 4.  Continue lorazepam 1 mg p.o. every 6 hours PRN CIWA greater than 10. 5.  Continue multivitamin 1 tablet p.o. daily for nutritional supplementation. 6.  Continue thiamine 100 mg p.o. daily for supplemental nutrition. 7.  Continue tramadol 100 mg p.o. every 12 hours as needed pain. 8.  Increase trazodone 200 mg p.o. nightly for sleep and insomnia. 9.  Disposition planning-in progress.  Antonieta Pert, MD 09/19/2018, 11:21 AM

## 2018-09-20 MED ORDER — CITALOPRAM HYDROBROMIDE 10 MG PO TABS
10.0000 mg | ORAL_TABLET | Freq: Every day | ORAL | Status: DC
  Administered 2018-09-20 – 2018-09-21 (×2): 10 mg via ORAL
  Filled 2018-09-20 (×3): qty 1

## 2018-09-20 NOTE — [facility] Group Notes (Signed)
Adult Psychoeducational Group Note  Date:  09/20/2018 Time:  5:57 PM  Group Topic/Focus:  Healthy Communication:   The focus of this group is to discuss communication, barriers to communication, as well as healthy ways to communicate with others.  Participation Level:  Active  Participation Quality:  Appropriate  Affect:  Appropriate  Cognitive:  Appropriate  Insight: Appropriate  Engagement in Group:  Engaged  Modes of Intervention:  Activity, Discussion, Exploration, Rapport Building, Socialization and Support  Additional Comments:  Pt attended and participated during the group activity.  Briani Maul C 09/20/2018, 5:57 PM  

## 2018-09-20 NOTE — Progress Notes (Signed)
D.  Pt pleasant on approach, complaint of left knee pain.  Pt states that he "banged it" playing basketball today in rec group.  See pain flowsheet  Pt was positive for evening AA group, observed engaged in appropriate interaction with peers on the unit.  Pt denies SI/HI/AVH at this time.  A.  Support and encouragement offered, medication given as ordered  R. Pt remains safe on the unit, will continue to monitor.

## 2018-09-20 NOTE — Progress Notes (Signed)
Pt attends group and participates.  Pt sts he is for d/c on Monday.  Pt sts his anxiety goes up after calls with his wife.  Pt does deny si hi and avh and contracts for safety.   Pt is med compliant, cooperative and pleasant.  Pt sts he wants to attend rehab and then a halfway house. Pt is med compliant.  Pt offered support and encouragement. Pt remains safe on unit.
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
## 2018-09-20 NOTE — Progress Notes (Signed)
Patient did attend the evening speaker AA meeting.  

## 2018-09-20 NOTE — [facility] Group Notes (Signed)
LCSW Group Therapy Note  Date/Time:  09/20/2018   9:00AM-10:00AM  Type of Therapy and Topic:  Group Therapy:  Fears and Unhealthy/Healthy Coping Skills  Participation Level:  Active   Description of Group:  The focus of this group was to discuss some of the prevalent fears that patients experience, and to identify the commonalities among group members.  A fun exercise was used to initiate the discussion, followed by writing on the white board a group-generated list of unhealthy coping and healthy coping techniques to deal with each fear.    Therapeutic Goals: 1. Patient will be able to distinguish between healthy and unhealthy coping skills 2. Patient will identify and describe 3 fears they experience 3. Patient will identify one positive coping strategy for each fear they experience 4. Patient will respond empathetically to peers' statements regarding fears they experience  Summary of Patient Progress:  The patient expressed that he fears going through changes himself while people around him such as his wife do not change along with him.  He tries to talk to the person about it and engages in prayer to deal with itr.  Therapeutic Modalities Cognitive Behavioral Therapy Motivational Interviewing  Ambrose Mantle, LCSW

## 2018-09-21 MED ORDER — DIPHENHYDRAMINE HCL 25 MG PO CAPS
25.0000 mg | ORAL_CAPSULE | Freq: Once | ORAL | Status: AC
  Administered 2018-09-21: 25 mg via ORAL
  Filled 2018-09-21: qty 1

## 2018-09-21 MED ORDER — HYDROXYZINE HCL 25 MG PO TABS: 25.0000 mg | ORAL_TABLET | Freq: Four times a day (QID) | ORAL | 0 refills | Status: DC | PRN

## 2018-09-21 MED ORDER — DIPHENHYDRAMINE HCL 25 MG PO CAPS
ORAL_CAPSULE | ORAL | Status: AC
  Filled 2018-09-21: qty 1

## 2018-09-21 MED ORDER — TRAZODONE HCL 100 MG PO TABS: 100.0000 mg | ORAL_TABLET | Freq: Every evening | ORAL | 0 refills | Status: AC | PRN

## 2018-09-21 MED ORDER — CITALOPRAM HYDROBROMIDE 20 MG PO TABS
20.0000 mg | ORAL_TABLET | Freq: Every day | ORAL | Status: DC
  Filled 2018-09-21: qty 14
  Filled 2018-09-21: qty 1
  Filled 2018-09-21: qty 14

## 2018-09-21 MED ORDER — CITALOPRAM HYDROBROMIDE 20 MG PO TABS: 20.0000 mg | ORAL_TABLET | Freq: Every day | ORAL | 0 refills | Status: DC

## 2018-09-21 MED ORDER — GABAPENTIN 400 MG PO CAPS: 400.0000 mg | ORAL_CAPSULE | Freq: Three times a day (TID) | ORAL | 0 refills | Status: AC

## 2018-09-21 NOTE — [facility] Group Notes (Signed)
[facility] Group Notes:  (Nursing/MHT/Case Management/Adjunct)  Date:  09/21/2018  Time:  1:48 PM  Type of Therapy:  Psychoeducational Skills  Participation Level:  Did Not Attend  Participation Quality:  Did not attend  Affect:  Did not attend  Cognitive:  Did not attend  Insight:  None  Engagement in Group:  Did not attend  Modes of Intervention:  Did not attend  Summary of Progress/Problems: Pt did not attend Psychoeducational group with topic healthy support systems.   Jacquelyne Balint Shanta 09/21/2018, 1:48 PM

## 2018-09-21 NOTE — Progress Notes (Signed)
[facility]  09/21/2018 10:06 AM Dakota Williams  MRN:  [id number]    Evaluation: Dakota Williams is awake alert and oriented x3.  Reports feeling better overall.  Patient states he is eager to discharge soon as he has been accepted into [facility] residential treatment program.  Dakota Williams reports he is going to continue work on his self, in hopes that his wife will follow suit.  He denies suicidal homicidal ideations.  Denies auditory visual hallucinations.  Reports taking and tolerating medications well.  Reports the Neurontin has really helped his mood.  Reports a good appetite states he is resting well throughout the night.  Patient scheduled to discharge 09/22/2018 support encouragement reassurance was provided  History: Per admission assessment note: Patient is a 28 year old male with a past psychiatric history significant for alcohol dependence who [facility] on [facility]/16/2018 with suicidal ideation. The patient was found via EMS in a [employer] parking lot. He was in a broken down car. The patient was unable to fully assess in the field because of altered mental status. He was unable to recall how he got to the hospital. On admission in the emergency room his blood alcohol was 177. He stated that 2 to 3 days prior to this admission he had been at [facility] and had gone through detox. He was released this previous Friday. He stated he had psychosocial stressors including a recent break-up with his wife, and probable loss of job on the date of admission. He stated he noticed that he has alcohol-related issues, but is had difficult time controlling them. He has not been in any residential or outpatient treatment. Because of the break-up with a wife he is essentially homeless. He had recently been diagnosed with bipolar disorder and has a history of depression as well as severe anxiety. He does have a 50-be restraining order against him  because of physical violence toward his wife. He was admitted to the hospital for evaluation and stabilization.   Principal Problem: <principal problem not specified> Diagnosis: Active Problems:   MDD (major depressive disorder), severe (HCC)  Total Time spent with patient: 30 minutes  Past Psychiatric History: See admission H&P  Past Medical History:  Past Medical History:  Diagnosis Date  . Anxiety   . Asthma   . Bipolar 1 disorder (HCC)   . ETOH abuse    History reviewed. No pertinent surgical history. Family History: History reviewed. No pertinent family history. Family Psychiatric  History: See admission H&P Social History:  Social History   Substance and Sexual Activity  Alcohol Use Yes  . Alcohol/week: 8.0 standard drinks  . Types: 2 Cans of beer, 6 Standard drinks or equivalent per week   Comment: fifth 3 days weekly     Social History   Substance and Sexual Activity  Drug Use No    Social History   Socioeconomic History  . Marital status: Single    Spouse name: Not on file  . Number of children: Not on file  . Years of education: Not on file  . Highest education level: Not on file  Occupational History  . Not on file  Social Needs  . Financial resource strain: Not on file  . Food insecurity:    Worry: Not on file    Inability: Not on file  . Transportation needs:    Medical: Not on file    Non-medical: Not on file  Tobacco Use  . Smoking status:  Current Every Day Smoker    Packs/day: 0.50  . Smokeless tobacco: Never Used  Substance and Sexual Activity  . Alcohol use: Yes    Alcohol/week: 8.0 standard drinks    Types: 2 Cans of beer, 6 Standard drinks or equivalent per week    Comment: fifth 3 days weekly  . Drug use: No  . Sexual activity: Not on file  Lifestyle  . Physical activity:    Days per week: Not on file    Minutes per session: Not on file  . Stress: Not on file  Relationships  . Social connections:    Talks on phone: Not on  file    Gets together: Not on file    Attends religious service: Not on file    Active member of club or organization: Not on file    Attends meetings of clubs or organizations: Not on file    Relationship status: Not on file  Other Topics Concern  . Not on file  Social History Narrative  . Not on file   Additional Social History:                         Sleep: Fair  Appetite:  Good  Current Medications: Current Facility-Administered Medications  Medication Dose Route Frequency Provider Last Rate Last Dose  . alum & mag hydroxide-simeth (MAALOX/MYLANTA) 200-200-20 MG/5ML suspension 30 mL  30 mL Oral Q4H PRN Kerry HoughSimon, Spencer E, PA-C      . [START ON 09/22/2018] citalopram (CELEXA) tablet 20 mg  20 mg Oral Daily Oneta RackLewis, Tylisa Alcivar N, NP      . folic acid (FOLVITE) tablet 1 mg  1 mg Oral Daily Antonieta Pertlary, Greg Lawson, MD   1 mg at 09/21/18 0754  . gabapentin (NEURONTIN) capsule 400 mg  400 mg Oral TID Antonieta Pertlary, Greg Lawson, MD   400 mg at 09/21/18 0754  . hydrOXYzine (ATARAX/VISTARIL) tablet 25 mg  25 mg Oral Q6H PRN Kerry HoughSimon, Spencer E, PA-C   25 mg at 09/20/18 2127  . magnesium hydroxide (MILK OF MAGNESIA) suspension 30 mL  30 mL Oral Daily PRN Kerry HoughSimon, Spencer E, PA-C      . multivitamin with minerals tablet 1 tablet  1 tablet Oral Daily Kerry HoughSimon, Spencer E, PA-C   1 tablet at 09/21/18 0754  . thiamine (B-1) injection 100 mg  100 mg Intramuscular Once Donell SievertSimon, Spencer E, PA-C      . thiamine (VITAMIN B-1) tablet 100 mg  100 mg Oral Daily Donell SievertSimon, Spencer E, PA-C   100 mg at 09/21/18 0754  . traMADol (ULTRAM) tablet 100 mg  100 mg Oral Q12H PRN Antonieta Pertlary, Greg Lawson, MD   100 mg at 09/21/18 0758  . traZODone (DESYREL) tablet 100 mg  100 mg Oral QHS,MR X 1 Antonieta Pertlary, Greg Lawson, MD   100 mg at 09/20/18 2252    Lab Results:  No results found for this or any previous visit (from the past 48 hour(s)).  Blood Alcohol level:  Lab Results  Component Value Date   ETH 171 (H) 09/16/2018   ETH 199 (H)  03/24/2018    Metabolic Disorder Labs: No results found for: HGBA1C, MPG No results found for: PROLACTIN No results found for: CHOL, TRIG, HDL, CHOLHDL, VLDL, LDLCALC  Physical Findings: AIMS: Facial and Oral Movements Muscles of Facial Expression: None, normal Lips and Perioral Area: None, normal Jaw: None, normal Tongue: None, normal,Extremity Movements Upper (arms, wrists, hands, fingers): None, normal Lower (legs, knees,  ankles, toes): None, normal, Trunk Movements Neck, shoulders, hips: None, normal, Overall Severity Severity of abnormal movements (highest score from questions above): None, normal Incapacitation due to abnormal movements: None, normal Patient's awareness of abnormal movements (rate only patient's report): No Awareness, Dental Status Current problems with teeth and/or dentures?: No Does patient usually wear dentures?: No  CIWA:  CIWA-Ar Total: 0 COWS:  COWS Total Score: 3  Musculoskeletal: Strength & Muscle Tone: within normal limits Gait & Station: normal Patient leans: N/A  Psychiatric Specialty Exam: Physical Exam  Nursing note and vitals reviewed. Constitutional: He is oriented to person, place, and time. He appears well-developed and well-nourished.  HENT:  Head: Normocephalic and atraumatic.  Respiratory: Effort normal.  Neurological: He is alert and oriented to person, place, and time.  Psychiatric: He has a normal mood and affect. His behavior is normal.    Review of Systems  Psychiatric/Behavioral: Positive for depression and suicidal ideas. The patient is nervous/anxious.   All other systems reviewed and are negative.   Blood pressure 123/83, pulse 80, temperature 97.7 F (36.5 C), temperature source Oral, resp. rate 16, height 6\' 2"  (1.88 m), weight 75.8 kg, SpO2 98 %.Body mass index is 21.44 kg/m.  General Appearance: Casual  Eye Contact:  Fair  Speech:  Normal Rate  Volume:  Normal  Mood:  Anxious and Irritable  Affect:  Congruent   Thought Process:  Coherent and Descriptions of Associations: Intact  Orientation:  Full (Time, Place, and Person)  Thought Content:  Logical  Suicidal Thoughts:  No  Homicidal Thoughts:  No  Memory:  Immediate;   Fair Recent;   Fair Remote;   Fair  Judgement:  Intact  Insight:  Fair  Psychomotor Activity:  Normal  Concentration:  Concentration: Fair and Attention Span: Fair  Recall:  [employer] of Knowledge:  Fair  Language:  Fair  Akathisia:  Negative  Handed:  Right  AIMS (if indicated):     Assets:  Communication Skills Desire for Improvement Physical Health Resilience  ADL's:  Intact  Cognition:  WNL  Sleep:  Number of Hours: 6.5     Treatment Plan Summary: Daily contact with patient to assess and evaluate symptoms and progress in treatment and Medication management   Continue her current treatment plan on 09/21/2018 as listed below except were noted  Increased citalopram 10 mg to 20 mg  p.o. daily for depression/anxiety 1.  Continue lorazepam 1 mg p.o. every 6 hours PRN CIWA greater than 10. 2.  Continue gabapentin to 400 mg p.o. 3 times daily for pain and anxiety. 3.  Continue hydroxyzine 25 mg p.o. every 6 hours as needed anxiety. 4.  Continue thiamine 100 mg p.o. daily for nutritional supplementation. 5.  Continue folic acid 1 mg p.o. daily for nutritional supplementation. 6.  Continue tramadol to 100 mg p.o. every 12 hours PRN pain. 7.  Continue trazodone 50 mg p.o. nightly as needed insomnia.  8.  Disposition planning-in progress.-[facility] screening 09/22/2018  Oneta Rack, NP 09/21/2018, 10:06 AM

## 2018-09-21 NOTE — Plan of Care (Signed)
  Problem: Education: Goal: Utilization of techniques to improve thought processes will improve Outcome: Progressing Goal: Knowledge of the prescribed therapeutic regimen will improve Outcome: Progressing   

## 2018-09-21 NOTE — BHH Suicide Risk Assessment (Signed)
[facility] Discharge Suicide Risk Assessment   Principal Problem: <principal problem not specified> Discharge Diagnoses: Active Problems:   MDD (major depressive disorder), severe (HCC)   Total Time spent with patient: 15 minutes  Musculoskeletal: Strength & Muscle Tone: within normal limits Gait & Station: normal Patient leans: N/A  Psychiatric Specialty Exam: Review of Systems  All other systems reviewed and are negative.   Blood pressure 123/83, pulse 80, temperature 97.7 F (36.5 C), temperature source Oral, resp. rate 16, height 6\' 2"  (1.88 m), weight 75.8 kg, SpO2 98 %.Body mass index is 21.44 kg/m.  General Appearance: Casual  Eye Contact::  Good  Speech:  Normal Rate409  Volume:  Normal  Mood:  Euthymic  Affect:  Congruent  Thought Process:  Coherent and Descriptions of Associations: Intact  Orientation:  Full (Time, Place, and Person)  Thought Content:  Logical  Suicidal Thoughts:  No  Homicidal Thoughts:  No  Memory:  Immediate;   Fair Recent;   Fair Remote;   Fair  Judgement:  Fair  Insight:  Fair  Psychomotor Activity:  Normal  Concentration:  Good  Recall:  Good  Fund of Knowledge:Good  Language: Good  Akathisia:  Negative  Handed:  Right  AIMS (if indicated):     Assets:  Communication Skills Desire for Improvement Leisure Time Physical Health Resilience Social Support  Sleep:  Number of Hours: 6.5  Cognition: WNL  ADL's:  Intact   Mental Status Per Nursing Assessment::   On Admission:  Suicidal ideation indicated by patient  Demographic Factors:  Male, Low socioeconomic status and Unemployed  Loss Factors: Loss of significant relationship  Historical Factors: Impulsivity  Risk Reduction Factors:   Sense of responsibility to family and Positive coping skills or problem solving skills  Continued Clinical Symptoms:  Depression:   Comorbid alcohol abuse/dependence Impulsivity Alcohol/Substance Abuse/Dependencies  Cognitive Features That  Contribute To Risk:  None    Suicide Risk:  Minimal: No identifiable suicidal ideation.  Patients presenting with no risk factors but with morbid ruminations; may be classified as minimal risk based on the severity of the depressive symptoms  Follow-up Information    Services, [facility] Follow up on 09/29/2018.   Why:  Screening for possible admission on Monday, 2/10 at 7:45AM. Per June, you may also walk in on Monday 2/3 at 7:45AM to be screened for possible admission. Please bring: 14 day supply of medications, proof of [employer] residency, and clothing.  Contact information: [address] [place] [address] [phone]        [facility] Follow up.   Specialty:  Behavioral Health Why:  Please walk in within 3 days of hospital/residential treatment discharge to be assessed for outpatient mental health services including: Medication Management and Therapy. Walk in hours: Mon-Fri 8:00AM-10:00AM. Thank you.  Contact information: [address] [place] [address] [phone]           Plan Of Care/Follow-up recommendations:  Activity:  ad lib  Antonieta Pert, MD 09/21/2018, 2:15 PM

## 2018-09-21 NOTE — BHH Group Notes (Signed)
[facility] LCSW Group Therapy Note  09/21/2018   10:00-11:00AM  Type of Therapy and Topic:  Group Therapy:  Unhealthy versus Healthy Supports, Which Am I?  Participation Level:  Active   Description of Group:  Patients in this group were introduced to the concept that additional supports including self-support are an essential part of recovery.  Initially a discussion was held about the differences between healthy versus unhealthy supports.  Patients were asked to share what unhealthy supports in their lives need to be addressed, as well as what additional healthy supports could be added for greater help in reaching their goals.   A song entitled "My Own Hero" was played and a group discussion ensued in which patients stated they could relate to the song and it inspired them to realize they have be willing to help themselves in order to succeed, because other people cannot achieve sobriety or stability for them.  We discussed adding a variety of healthy supports to address the various needs in patient lives, including becoming more self-supportive.  Therapeutic Goals: 1)  Highlight the differences between healthy and unhealthy supports 2)  Suggest the importance of being a part of one's own support system 2)  Discuss reasons people in one's life may eventually be unable to be continually supportive  3)  Identify the patient's current support system and   4)  elicit commitments to add healthy supports and to become more conscious of being self-supportive   Summary of Patient Progress:  The patient expressed that the unhealthy support which needs to be addressed includes having grown up with no support at all and now being around people who tell him it is okay for him to drink even though he knows it is not true.  Healthy supports which could be added for increased stability and happiness include a positive, clean circle of friends.  Therapeutic Modalities:   Motivational Interviewing Activity  Lynnell Chad

## 2018-09-21 NOTE — Progress Notes (Signed)
D.  Pt pleasant on approach, no complaints voiced.  Pt acknowledged discharge in the AM to [facility], and verbalized understanding.  Pt observed in dayroom appropriately interacting with peers.  Pt denies SI/HI/AVH at this time.  A.  Support and encouragement offered, explained discharge process that will occur in AM to Pt.  Medication given as ordered  R.  Pt verbalized understanding, remains safe on the unit.  Will continue to monitor.

## 2018-09-21 NOTE — Progress Notes (Signed)
D Pt is UAL on the 300 hall. HE is hyperverbal about his potential discharge " Im supposed to go to [facility] tomorrow ornin for my screening". He is seen laughing, joking and socializing with his peers.     A He presented to the nurses station at 1245, leaving the cafe' early , he said " I think I'm having an allergic reaction". Pt reorted he had a known allergy to shellfich and that he had put soy sauce on his food for lunch. VO obtained fr Dr Jola Babinski and he was given 25 mg PO BEnadryl. 1245. 135/79 O2 99 0/0 and he went to sleep./ Pt slept approx 3 hours. Upon awakening, he said " I feel so much better.Marland Kitchenibuprofen think I was ahving anxiety". He completed his daily assessment and on this he wrote he dneied SI today and he rated his depression, hopelessness and axneity " 3/2/2', respectively. He is scheduled for am DC at 0700, cab voucher and all printed dc instrucitons along with med samples and prescriptions are on pt's shadow chart. Pt slated for am interview assessment at [facility].    R Safety in pale.

## 2018-09-22 DIAGNOSIS — F1010 Alcohol abuse, uncomplicated: Secondary | ICD-10-CM

## 2018-09-22 NOTE — Progress Notes (Addendum)
Discharge note:    Pt discharged to lobby to await [facility] scheduled to come at 0700.  Pt given all discharge instructions, sample medications, and prescriptions.  Pt collected belongings out of locker 35.  Pt denied SI/HI/AVH at this time.  Pt did state that he had a [employer] that was not in his belongings.  This staff member spoke with the admitting nurse and MHT and both stated Pt did not have jacket when he was admitted, he was wrapped up with two blankets.  Pt states it may have been over in the ED that he removed his jacket.  Pt advised to contact [facility] ED, security guard Danny notified as well and stated that he will check with [facility].  Pt stated that this was acceptable.  Pt in lobby, breakfast taken to him.  No questions or concerns voiced at this time.  Cab came for Pt, Security still waiting for call back from [facility] security re: jacket.  Security will notify Pt at [facility] of findings.

## 2018-09-22 NOTE — Progress Notes (Signed)
D Pt is observed OOB UAL on the 300 hall today. HE is overheard by this Clinical research associate, speaking to his peers in the 300 hall dayroom, when he is not aware he is being heard by this staff member, " man this place is some bull s---....this is a waste of time.Marland Kitchenibuprofen need to get Arlan Organ here so I can get home and take care of some business ...these people dont know nothin". HE wears hospital-issued patient scrubs. HE wears a hospital towel draped over his right shoulder. He is flat and blunted. He changes his answers to this writer's quesitons frequently, ie first he says he slept "ok" and then he says "Im still not sleeping like I need to ".     A He completed his daily assessment and on this he wrote he denied having SI today and he rated his depression, hopelessness and anxiety " 5/4/4/", respectively.     R Safety in place.
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
## 2018-10-09 ENCOUNTER — Emergency Department (HOSPITAL_BASED_OUTPATIENT_CLINIC_OR_DEPARTMENT_OTHER)
Admission: EM | Admit: 2018-10-09 | Discharge: 2018-10-09 | Disposition: A | Discharge status: Home / Self Care | Payer: Medicaid Other | Attending: Emergency Medicine | Admitting: Emergency Medicine

## 2018-10-09 ENCOUNTER — Other Ambulatory Visit: Payer: Self-pay

## 2018-10-09 ENCOUNTER — Encounter (HOSPITAL_BASED_OUTPATIENT_CLINIC_OR_DEPARTMENT_OTHER): Payer: Self-pay | Admitting: Emergency Medicine

## 2018-10-09 DIAGNOSIS — F17200 Nicotine dependence, unspecified, uncomplicated: Secondary | ICD-10-CM | POA: Insufficient documentation

## 2018-10-09 DIAGNOSIS — J45909 Unspecified asthma, uncomplicated: Secondary | ICD-10-CM | POA: Insufficient documentation

## 2018-10-09 DIAGNOSIS — Z79899 Other long term (current) drug therapy: Secondary | ICD-10-CM | POA: Insufficient documentation

## 2018-10-09 DIAGNOSIS — N4830 Priapism, unspecified: Secondary | ICD-10-CM

## 2018-10-09 DIAGNOSIS — Z880 Allergy status to penicillin: Secondary | ICD-10-CM | POA: Insufficient documentation

## 2018-10-09 HISTORY — DX: Post-traumatic stress disorder, unspecified: F43.10

## 2018-10-09 MED ORDER — LIDOCAINE HCL (PF) 1 % IJ SOLN
5.0000 mL | Freq: Once | INTRAMUSCULAR | Status: AC
  Administered 2018-10-09: 5 mL
  Filled 2018-10-09: qty 5

## 2018-10-09 MED ORDER — LORAZEPAM 2 MG/ML IJ SOLN
2.0000 mg | Freq: Once | INTRAMUSCULAR | Status: AC
  Administered 2018-10-09: 2 mg via INTRAVENOUS
  Filled 2018-10-09: qty 1

## 2018-10-09 MED ORDER — PHENYLEPHRINE 200 MCG/ML FOR PRIAPISM / HYPOTENSION
50.0000 ug | INTRAMUSCULAR | Status: DC | PRN
  Administered 2018-10-09: 50 ug via INTRACAVERNOUS
  Filled 2018-10-09 (×2): qty 50

## 2018-10-09 NOTE — ED Triage Notes (Signed)
Pt is from [facility]. Reports he has had an erection since 4am.

## 2018-10-09 NOTE — ED Notes (Signed)
ED Provider at bedside. 

## 2018-10-09 NOTE — ED Provider Notes (Signed)
[facility] EMERGENCY DEPARTMENT Provider Note   CSN: [id number] Arrival date & time: 10/09/18  1100    History   Chief Complaint Chief Complaint  Patient presents with  . Priapism    HPI Frances NickelsSteven M Sandefur is a 28 y.o. male w PMHx EtOH abuse, asthma, depression, [facility]. Pt states he woke up this morning at 4am with an unprovoked erection. Erection has been persistent and increasingly painful since that time. He applied ice to his groin x1 hour without relief. He had an erection on Tuesday morning however lasted 30 minutes and resolved without intervention. This episode today has never happened before. No hx sickle cell anemia. Taking trazodone, citalopram, vistaril, gabapentin. Recent dose increase of trazodone yesterday. No other medications or drugs used in the last 24 hours.      The history is provided by the patient.    Past Medical History:  Diagnosis Date  . Anxiety   . Asthma   . Bipolar 1 disorder (HCC)   . ETOH abuse   . PTSD (post-traumatic stress disorder)     Patient Active Problem List   Diagnosis Date Noted  . Alcohol abuse 09/22/2018  . MDD (major depressive disorder), severe (HCC) 09/17/2018    History reviewed. No pertinent surgical history.      Home Medications    Prior to Admission medications   Medication Sig Start Date End Date Taking? Authorizing Provider  citalopram (CELEXA) 20 MG tablet Take 1 tablet (20 mg total) by mouth daily. 09/22/18   Oneta RackLewis, Tanika N, NP  gabapentin (NEURONTIN) 400 MG capsule Take 1 capsule (400 mg total) by mouth 3 (three) times daily. 09/21/18   Oneta RackLewis, Tanika N, NP  hydrOXYzine (ATARAX/VISTARIL) 25 MG tablet Take 1 tablet (25 mg total) by mouth every 6 (six) hours as needed for anxiety. 09/21/18   Oneta RackLewis, Tanika N, NP  traZODone (DESYREL) 100 MG tablet Take 1 tablet (100 mg total) by mouth at bedtime and may repeat dose one time if needed. 09/21/18   Oneta RackLewis, Tanika N, NP    Family  History No family history on file.  Social History Social History   Tobacco Use  . Smoking status: Current Every Day Smoker    Packs/day: 0.50  . Smokeless tobacco: Never Used  Substance Use Topics  . Alcohol use: Yes    Alcohol/week: 8.0 standard drinks    Types: 2 Cans of beer, 6 Standard drinks or equivalent per week    Comment: fifth 3 days weekly  . Drug use: No     Allergies   Naproxen; Penicillins; Shellfish allergy; and Vicodin [hydrocodone-acetaminophen]   Review of Systems Review of Systems  All other systems reviewed and are negative.    Physical Exam Updated Vital Signs BP 128/72 (BP Location: Left Arm)   Pulse 83   Temp 98.1 F (36.7 C) (Oral)   Resp 16   Ht 6\' 3"  (1.905 m)   Wt 86.6 kg   SpO2 100%   BMI 23.87 kg/m   Physical Exam Vitals signs and nursing note reviewed.  Constitutional:      General: He is not in acute distress.    Appearance: He is well-developed.  HENT:     Head: Normocephalic and atraumatic.  Eyes:     Conjunctiva/sclera: Conjunctivae normal.  Cardiovascular:     Rate and Rhythm: Normal rate.  Pulmonary:     Effort: Pulmonary effort is normal.  Abdominal:     General: Bowel sounds  are normal.     Palpations: Abdomen is soft.     Tenderness: There is no abdominal tenderness.  Genitourinary:    Comments: Exam performed with RN chaperone present. Priapism present. Some tenderness with palpation. Glan appears pink. No penile discharge. Testicles are normal.  Skin:    General: Skin is warm.  Neurological:     Mental Status: He is alert.  Psychiatric:        Behavior: Behavior normal.      ED Treatments / Results  Labs (all labs ordered are listed, but only abnormal results are displayed) Labs Reviewed - No data to display  EKG None  Radiology No results found.  Procedures Irrigate corpus cavern, priapism Date/Time: 10/09/2018 12:30 PM Performed by: Robinson, [place] N, PA-C Authorized by: Robinson, [place]  N, PA-C  Consent: Verbal consent obtained. Risks and benefits: risks, benefits and alternatives were discussed Consent given by: patient Patient understanding: patient states understanding of the procedure being performed Imaging studies: imaging studies available Required items: required blood products, implants, devices, and special equipment available Patient identity confirmed: verbally with patient Time out: Immediately prior to procedure a "time out" was called to verify the correct patient, procedure, equipment, support staff and site/side marked as required. Preparation: Patient was prepped and draped in the usual sterile fashion. Local anesthesia used: yes (dorsal nerve block performed by Dr. Jacqulyn Bath. See his attestation for anesthesia procedure)  Anesthesia: Local anesthesia used: yes (dorsal nerve block performed by Dr. Jacqulyn Bath. See his attestation for anesthesia procedure)  Sedation: Patient sedated: no  Patient tolerance: Patient tolerated the procedure well with no immediate complications Comments: 6ml of blood was aspirated from each cavernosa. phenylephrine injected into each cavernosa. Some Improvement visualized immediately. Pressure dressing applied. Procedure performed with Dr. Jacqulyn Bath at bedside.     (including critical care time)  Medications Ordered in ED Medications  phenylephrine 200 mcg / ml CONC. DILUTION INJ (ED / Urology USE ONLY) (50 mcg Intracavernosal Given 10/09/18 1201)  lidocaine (PF) (XYLOCAINE) 1 % injection 5 mL (5 mLs Infiltration Given 10/09/18 1201)  LORazepam (ATIVAN) injection 2 mg (2 mg Intravenous Given 10/09/18 1135)     Initial Impression / Assessment and Plan / ED Course  I have reviewed the triage vital signs and the nursing notes.  Pertinent labs & imaging results that were available during my care of the patient were reviewed by me and considered in my medical decision making (see chart for details).  Clinical Course as of Oct 09 1724  Thu Oct 09, 2018  1228 Drained and injected phenylephrine at bedside.  Removed 81ml blood per sided. phenylephrine injected per side.   [JR]  1427 2nd round of injection, per side injected.   [JR]    Clinical Course User Index [JR] Robinson, [place] N, PA-C       Pt presenting with priapism of unknown etiology, however could be from recent dose increase of trazodone. Glans appears well-perfused on exam. Pt attempted topical ice at home without relief. No hx sickle cell. Pt initially given IV ativan without improvement. Irrigation of cavernosa performed at bedside with phenylephrine placed. After 1st dose of phenylephrine, moderate reduction in priapism achieved however not complete. Second dose of phenylephrine injected with complete resolution in symptoms and priapism. Pt overall tolerated procedures very well. Pt urinating in the ED without issue. Recommend patient discuss prescribed medications with his provider regarding today's episode.  Recommend patient return to previous dose of trazodone, with possibility of  this being contributory.  Return precautions discussed.  Patient discussed with and seen by Dr. Jacqulyn Bath.  Discussed results, findings, treatment and follow up. Patient advised of return precautions. Patient verbalized understanding and agreed with plan.   Final Clinical Impressions(s) / ED Diagnoses   Final diagnoses:  Priapism, unspecified    ED Discharge Orders    None       Robinson, [place] N, PA-C 10/09/18 1727    Maia Plan, MD 10/09/18 2021

## 2018-10-09 NOTE — Discharge Instructions (Addendum)
It is recommended that you go back to your previous dose of trazodone. Discuss your episode of priapism with your medical provider to evaluate the medications you are currently being prescribed as a possible cause. Return to the emergency department if you experience similar episode.

## 2018-10-09 NOTE — ED Provider Notes (Signed)
.  Nerve Block Date/Time: 10/09/2018 8:18 PM Performed by: Maia Plan, MD Authorized by: Maia Plan, MD   Consent:    Consent obtained:  Verbal   Consent given by:  Patient   Risks discussed:  Allergic reaction, bleeding, intravenous injection, infection, pain, nerve damage, swelling and unsuccessful block Indications:    Indications:  Pain relief and procedural anesthesia Location:    Nerve block body site: penis.   Laterality:  Bilateral Pre-procedure details:    Skin preparation:  2% chlorhexidine   Preparation: Patient was prepped and draped in usual sterile fashion   Skin anesthesia (see MAR for exact dosages):    Skin anesthesia method:  None Procedure details (see MAR for exact dosages):    Block needle gauge:  25 G   Guidance comment:  [facility] ID   Anesthetic injected:  Lidocaine 1% w/o epi   Steroid injected:  None   Additive injected:  None   Injection procedure:  Anatomic landmarks identified, anatomic landmarks palpated, introduced needle, incremental injection and negative aspiration for blood Post-procedure details:    Dressing:  Sterile dressing   Outcome:  Anesthesia achieved   Patient tolerance of procedure:  Tolerated well, no immediate complications      Maia Plan, MD 10/09/18 2019

## 2018-10-14 ENCOUNTER — Emergency Department (HOSPITAL_BASED_OUTPATIENT_CLINIC_OR_DEPARTMENT_OTHER)
Admission: EM | Admit: 2018-10-14 | Discharge: 2018-10-14 | Disposition: A | Discharge status: Home / Self Care | Payer: Medicaid Other | Attending: Emergency Medicine | Admitting: Emergency Medicine

## 2018-10-14 ENCOUNTER — Other Ambulatory Visit: Payer: Self-pay

## 2018-10-14 ENCOUNTER — Encounter (HOSPITAL_BASED_OUTPATIENT_CLINIC_OR_DEPARTMENT_OTHER): Payer: Self-pay | Admitting: *Deleted

## 2018-10-14 DIAGNOSIS — Z79899 Other long term (current) drug therapy: Secondary | ICD-10-CM | POA: Insufficient documentation

## 2018-10-14 DIAGNOSIS — N4830 Priapism, unspecified: Secondary | ICD-10-CM | POA: Insufficient documentation

## 2018-10-14 DIAGNOSIS — J45909 Unspecified asthma, uncomplicated: Secondary | ICD-10-CM | POA: Insufficient documentation

## 2018-10-14 DIAGNOSIS — F17210 Nicotine dependence, cigarettes, uncomplicated: Secondary | ICD-10-CM | POA: Insufficient documentation

## 2018-10-14 MED ORDER — PHENYLEPHRINE 200 MCG/ML FOR PRIAPISM / HYPOTENSION
50.0000 ug | INTRAMUSCULAR | Status: AC | PRN
  Administered 2018-10-14: 100 ug via INTRACAVERNOUS
  Administered 2018-10-14: 200 ug via INTRACAVERNOUS
  Filled 2018-10-14: qty 50

## 2018-10-14 MED ORDER — PHENYLEPHRINE 200 MCG/ML FOR PRIAPISM / HYPOTENSION
50.0000 ug | Freq: Once | INTRAMUSCULAR | Status: AC
  Administered 2018-10-14: 200 ug via INTRACAVERNOUS
  Filled 2018-10-14 (×2): qty 50

## 2018-10-14 MED ORDER — LIDOCAINE HCL (PF) 1 % IJ SOLN
2.0000 mL | Freq: Once | INTRAMUSCULAR | Status: AC
  Administered 2018-10-14: 12:00:00 via INTRADERMAL
  Filled 2018-10-14: qty 5

## 2018-10-14 MED ORDER — LORAZEPAM 2 MG/ML IJ SOLN
1.0000 mg | Freq: Once | INTRAMUSCULAR | Status: AC
  Administered 2018-10-14: 1 mg via INTRAMUSCULAR
  Filled 2018-10-14: qty 1

## 2018-10-14 MED ORDER — IBUPROFEN 400 MG PO TABS: 400.0000 mg | ORAL_TABLET | Freq: Four times a day (QID) | ORAL | 0 refills | Status: AC | PRN

## 2018-10-14 NOTE — ED Notes (Signed)
Priapism tray at bedside

## 2018-10-14 NOTE — ED Provider Notes (Signed)
[facility] EMERGENCY DEPARTMENT Provider Note   CSN: [id number] Arrival date & time: 10/14/18  1054    History   Chief Complaint Chief Complaint  Patient presents with  . Priapism    HPI Dakota Williams is a 28 y.o. male with past medical history of asthma, depression, bipolar disorder, alcohol abuse presenting to emergency department today with chief complaint of priapism.  The onset was acute, starting at 4 AM this morning.  He woke up with an unprovoked erection.  The erection has been increasingly painful since onset and has been persistent.  He applied ice to the groin x1 hour without relief.  Denies radiation of pain. Patient reports he had an erection yesterday that resolved without intervention.  His last dose of trazodone was last night.  He denies history of sickle cell. Ptwas seen here x4 days ago for same complaint.  His medications include trazodone, citalopram, Vistaril, gabapentin.      Past Medical History:  Diagnosis Date  . Anxiety   . Asthma   . Bipolar 1 disorder (HCC)   . ETOH abuse   . PTSD (post-traumatic stress disorder)     Patient Active Problem List   Diagnosis Date Noted  . Alcohol abuse 09/22/2018  . MDD (major depressive disorder), severe (HCC) 09/17/2018    History reviewed. No pertinent surgical history.      Home Medications    Prior to Admission medications   Medication Sig Start Date End Date Taking? Authorizing Provider  citalopram (CELEXA) 20 MG tablet Take 1 tablet (20 mg total) by mouth daily. 09/22/18   Oneta Rack, NP  gabapentin (NEURONTIN) 400 MG capsule Take 1 capsule (400 mg total) by mouth 3 (three) times daily. 09/21/18   Oneta Rack, NP  hydrOXYzine (ATARAX/VISTARIL) 25 MG tablet Take 1 tablet (25 mg total) by mouth every 6 (six) hours as needed for anxiety. 09/21/18   Oneta Rack, NP  ibuprofen (ADVIL,MOTRIN) 400 MG tablet Take 1 tablet (400 mg total) by mouth every 6 (six) hours as needed for  moderate pain. 10/14/18   Alvira Monday, MD  traZODone (DESYREL) 100 MG tablet Take 1 tablet (100 mg total) by mouth at bedtime and may repeat dose one time if needed. 09/21/18   Oneta Rack, NP    Family History No family history on file.  Social History Social History   Tobacco Use  . Smoking status: Current Every Day Smoker    Packs/day: 0.50  . Smokeless tobacco: Never Used  Substance Use Topics  . Alcohol use: Yes    Alcohol/week: 8.0 standard drinks    Types: 2 Cans of beer, 6 Standard drinks or equivalent per week    Comment: fifth 3 days weekly  . Drug use: No     Allergies   Naproxen; Penicillins; Shellfish allergy; and Vicodin [hydrocodone-acetaminophen]   Review of Systems Review of Systems  Constitutional: Negative for fever.  Genitourinary: Positive for penile pain and penile swelling. Negative for discharge, dysuria, scrotal swelling and testicular pain.  All other systems reviewed and are negative.    Physical Exam Updated Vital Signs BP 120/74 (BP Location: Right Arm)   Pulse 78   Temp 98.3 F (36.8 C) (Oral)   Resp 16   Ht 6\' 3"  (1.905 m)   Wt 86.6 kg   SpO2 99%   BMI 23.86 kg/m   Physical Exam Constitutional:      Comments: Patient appears uncomfortable secondary to pain.  Nontoxic-appearing,  not under acute distress.  HENT:     Head: Normocephalic and atraumatic.     Nose: Nose normal.  Eyes:     Conjunctiva/sclera: Conjunctivae normal.  Neck:     Musculoskeletal: Normal range of motion.  Cardiovascular:     Rate and Rhythm: Normal rate and regular rhythm.     Pulses: Normal pulses.     Heart sounds: Normal heart sounds.  Pulmonary:     Effort: Pulmonary effort is normal.     Breath sounds: Normal breath sounds.  Abdominal:     General: There is no distension.     Palpations: Abdomen is soft.     Tenderness: There is no abdominal tenderness. There is no guarding or rebound.  Genitourinary:    Comments: Exam performed with  chaperone EMT Madaline Guthrie present. Priapism present. Glans appears pink. There is tenderness with palpation. Testicles are normal. No penile discharge. Musculoskeletal: Normal range of motion.  Skin:    General: Skin is warm and dry.  Neurological:     Mental Status: He is alert. Mental status is at baseline.  Psychiatric:        Behavior: Behavior normal.      ED Treatments / Results  Labs (all labs ordered are listed, but only abnormal results are displayed) Labs Reviewed - No data to display  EKG None  Radiology No results found.  Procedures Procedures (including critical care time)  Medications Ordered in ED Medications  LORazepam (ATIVAN) injection 1 mg (1 mg Intramuscular Given 10/14/18 1202)  lidocaine (PF) (XYLOCAINE) 1 % injection 2 mL ( Intradermal Given 10/14/18 1220)  phenylephrine 200 mcg / ml CONC. DILUTION INJ (ED / Urology USE ONLY) (200 mcg Intracavernosal Given by Other 10/14/18 1256)  phenylephrine 200 mcg / ml CONC. DILUTION INJ (ED / Urology USE ONLY) (100 mcg Intracavernosal Given by Other 10/14/18 1350)     Initial Impression / Assessment and Plan / ED Course  I have reviewed the triage vital signs and the nursing notes.  Pertinent labs & imaging results that were available during my care of the patient were reviewed by me and considered in my medical decision making (see chart for details).    Pt is afebrile, uncomfortable appearing, not under acute distress.  His history of priapism in setting of trazdone.  Patient given IM Ativan without improvement.  On exam glans appears flaccid, well perfused.  He tried topical ice without relief at home. See attending Dr. Burr Medico note for details.  In short, irrigation of cavernosum performed at bedside with phenylephrine placed. Injected with phenylephrine 200 mcg twice with more than 40 cc drained with moderate reduction in priapism however not complete. Irrigated with saline. Third dose of phenylephrine 100 mcg  with detumesence.  Patient will discontinue trazodone as this is possible because of his priapism.  Notified [facility] to discontinue the medication,  patient and [facility] are in agreement to this plan.  Patient urinating in the ED without complications. Discussed strict ED return precautions. Pt verbalized understanding of and is in agreement with this plan. Pt stable for discharge home at this time. The patient was discussed with and seen by Dr. Dalene Seltzer who agrees with the treatment plan.         Final Clinical Impressions(s) / ED Diagnoses   Final diagnoses:  Priapism    ED Discharge Orders         Ordered    ibuprofen (ADVIL,MOTRIN) 400 MG tablet  Every 6 hours PRN  10/14/18 1643           Sherene Sires, PA-C 10/17/18 1131    Alvira Monday, MD 10/18/18 2115

## 2018-10-14 NOTE — Discharge Instructions (Addendum)
Discontinue trazadone. Continue other home medications. You can take Ibuprofen for pain as needed. Please take as directed.

## 2018-10-14 NOTE — ED Notes (Signed)
Pt refused Ice pack 

## 2018-10-14 NOTE — ED Notes (Signed)
ED Provider at bedside. 

## 2018-10-14 NOTE — ED Triage Notes (Signed)
Pt is from [facility]. He has a hx of priapism a week ago. Here this am with same.

## 2018-10-15 NOTE — ED Provider Notes (Addendum)
  Physical Exam  BP 120/74 (BP Location: Right Arm)   Pulse 78   Temp 98.3 F (36.8 C) (Oral)   Resp 16   Ht 6\' 3"  (1.905 m)   Wt 86.6 kg   SpO2 99%   BMI 23.86 kg/m   Physical Exam  ED Course/Procedures     Irrigate corpus cavern, priapism Date/Time: 10/18/2018 9:16 PM Performed by: Alvira Monday, MD Authorized by: Alvira Monday, MD  Consent: Verbal consent obtained. Risks and benefits: risks, benefits and alternatives were discussed Consent given by: patient Patient understanding: patient states understanding of the procedure being performed Patient consent: the patient's understanding of the procedure matches consent given Procedure consent: procedure consent matches procedure scheduled Relevant documents: relevant documents present and verified Test results: test results available and properly labeled Site marked: the operative site was marked Imaging studies: imaging studies available Required items: required blood products, implants, devices, and special equipment available Patient identity confirmed: verbally with patient Time out: Immediately prior to procedure a "time out" was called to verify the correct patient, procedure, equipment, support staff and site/side marked as required. Preparation: Patient was prepped and draped in the usual sterile fashion. Local anesthesia used: yes Anesthesia: nerve block  Anesthesia: Local anesthesia used: yes Local Anesthetic: lidocaine 1% without epinephrine Patient tolerance: Patient tolerated the procedure well with no immediate complications     MDM    Shared visit with Albrizze PA-C.  Presents with recurrent priapism in the setting of trazodone use.  Drainage of priapism of greater than 40cc without detumescence, irrigated with saline without relief.  Injected with phenylephrine x2 and x1 with detumescence.  Called [facility] to discuss he needs to discontinue trazodone.  Patient and [facility] state  understanding.      Alvira Monday, MD 10/18/18 2119

## 2020-07-29 ENCOUNTER — Other Ambulatory Visit: Payer: Self-pay

## 2020-07-29 ENCOUNTER — Encounter (HOSPITAL_COMMUNITY): Payer: Self-pay | Admitting: Licensed Clinical Social Worker

## 2020-07-29 ENCOUNTER — Ambulatory Visit (INDEPENDENT_AMBULATORY_CARE_PROVIDER_SITE_OTHER): Payer: No Payment, Other | Admitting: Licensed Clinical Social Worker

## 2020-07-29 ENCOUNTER — Encounter (HOSPITAL_COMMUNITY): Payer: Self-pay | Admitting: Physician Assistant

## 2020-07-29 ENCOUNTER — Ambulatory Visit (INDEPENDENT_AMBULATORY_CARE_PROVIDER_SITE_OTHER): Payer: No Payment, Other | Admitting: Physician Assistant

## 2020-07-29 VITALS — BP 135/84 | HR 73 | Ht 74.0 in | Wt 193.0 lb

## 2020-07-29 DIAGNOSIS — F3181 Bipolar II disorder: Secondary | ICD-10-CM

## 2020-07-29 DIAGNOSIS — F411 Generalized anxiety disorder: Secondary | ICD-10-CM | POA: Diagnosis not present

## 2020-07-29 DIAGNOSIS — F4310 Post-traumatic stress disorder, unspecified: Secondary | ICD-10-CM | POA: Diagnosis not present

## 2020-07-29 MED ORDER — LAMOTRIGINE 25 MG PO TABS: 50.0000 mg | ORAL_TABLET | Freq: Every day | ORAL | 0 refills | Status: DC

## 2020-07-29 MED ORDER — HYDROXYZINE HCL 25 MG PO TABS: 25.0000 mg | ORAL_TABLET | Freq: Three times a day (TID) | ORAL | 0 refills | Status: DC | PRN
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
## 2020-08-01 ENCOUNTER — Encounter (HOSPITAL_COMMUNITY): Payer: Self-pay | Admitting: Physician Assistant

## 2020-08-26 ENCOUNTER — Telehealth (HOSPITAL_COMMUNITY): Payer: Self-pay | Admitting: *Deleted

## 2020-08-26 NOTE — Telephone Encounter (Signed)
hydrOXYzine (ATARAX/VISTARIL) 25 MG tablet   0 07/29/2020    Sig - Route: Take 1 tablet (25 mg total) by mouth 3 (three) times daily as needed    Patient called stated he took one tablet this AM went out to eat started having anxiety/panic attack to the point he felt like he was choking on the food just swallowed & restaurant was about to call for EMT.  Patient  Says he has now taken total of  2 tabs with only 1 left to take @ sig: instructions. He doesn't want to worry about choking when he gets ready to eat this evening. Patient states he mentioned that he previously was on same medication taking 2 daily  & 4 times daily as needed.  Patient stated he told to call if any problem concerning medication.

## 2020-08-28 ENCOUNTER — Other Ambulatory Visit (HOSPITAL_COMMUNITY): Payer: Self-pay | Admitting: Physician Assistant

## 2020-08-28 DIAGNOSIS — F411 Generalized anxiety disorder: Secondary | ICD-10-CM

## 2020-08-28 MED ORDER — HYDROXYZINE HCL 25 MG PO TABS: 25.0000 mg | ORAL_TABLET | Freq: Three times a day (TID) | ORAL | 0 refills | Status: DC | PRN

## 2020-08-28 NOTE — Telephone Encounter (Signed)
Patient's medication e-prescribed to pharmacy of choice. 

## 2020-08-28 NOTE — Progress Notes (Signed)
Patient's medications e-prescribed to pharmacy of choice.

## 2020-09-01 ENCOUNTER — Ambulatory Visit (HOSPITAL_COMMUNITY): Payer: No Payment, Other | Admitting: Physician Assistant

## 2020-09-01 ENCOUNTER — Telehealth (HOSPITAL_COMMUNITY): Payer: Self-pay | Admitting: *Deleted

## 2020-09-01 ENCOUNTER — Ambulatory Visit (HOSPITAL_COMMUNITY): Payer: No Payment, Other | Admitting: Licensed Clinical Social Worker

## 2020-09-01 NOTE — Telephone Encounter (Signed)
Call from patient stating he is taking 4 hydroxyzine a day to barely keep his anxiety under control. He is ordered 3 a day so he will be running out sooner than planned. He is checking in to make sure the dr knows its not enough medicine and also to make sure a new rx will be available to him when he runs out. His provider is Link Snuffer PA who is out of the office today. Will check with him on his return re patients concern. He has enough medicine for now. I will call him back when I have spoken with his provider.

## 2020-09-01 NOTE — Telephone Encounter (Signed)
Writer was contacted by Wynona Luna, RN regarding patient's medication. Refill for Hydroxyzine 25 mg was e-prescribed to patient's pharmacy of choice on 08/28/2020

## 2020-09-02 ENCOUNTER — Telehealth (HOSPITAL_COMMUNITY): Payer: Self-pay | Admitting: *Deleted

## 2020-09-02 NOTE — Telephone Encounter (Signed)
Called about 330 to follow up if his medicine got called in. He has been doubling his dose. Hydroxyzine called in on the 9th. He also asked where he can stay, his wife kicked him out today. We are supposed to have winter weather this weekend and be in the 20's. Called peer support for the information on the emergency shelter at the [facility]. Provided patient with needed information on shelter. Medicine is at [facility] who close in an hour and are closed for the [facility] holiday mon. He expressed his thanks and is going to follow up with Rx and shelter.

## 2020-09-09 ENCOUNTER — Other Ambulatory Visit: Payer: Self-pay

## 2020-09-09 ENCOUNTER — Telehealth (INDEPENDENT_AMBULATORY_CARE_PROVIDER_SITE_OTHER): Payer: No Payment, Other | Admitting: Physician Assistant

## 2020-09-09 DIAGNOSIS — F4310 Post-traumatic stress disorder, unspecified: Secondary | ICD-10-CM

## 2020-09-09 DIAGNOSIS — F411 Generalized anxiety disorder: Secondary | ICD-10-CM

## 2020-09-09 DIAGNOSIS — F3181 Bipolar II disorder: Secondary | ICD-10-CM

## 2020-09-09 MED ORDER — PRAZOSIN HCL 1 MG PO CAPS: 1.0000 mg | ORAL_CAPSULE | Freq: Every day | ORAL | 1 refills | Status: DC

## 2020-09-09 MED ORDER — CITALOPRAM HYDROBROMIDE 20 MG PO TABS: 20.0000 mg | ORAL_TABLET | Freq: Every day | ORAL | 1 refills | Status: DC

## 2020-09-09 MED ORDER — LAMOTRIGINE 25 MG PO TABS: 50.0000 mg | ORAL_TABLET | Freq: Every day | ORAL | 1 refills | Status: DC

## 2020-09-12 ENCOUNTER — Telehealth (HOSPITAL_COMMUNITY): Payer: Self-pay | Admitting: *Deleted

## 2020-09-12 NOTE — Telephone Encounter (Signed)
Patient walked in noted that he had a allergic reaction to  citalopram (CELEXA) 20 MG tablet 30 tablet 1 09/09/2020    Sig - Route: Take 1 tablet (20 mg total) by mouth daily. - Oral     AND THAT EMS WAS CALLED -- PATIENT HAS STOPPED TAKING MEDICATION PER INSTRUCTION & WAITING ON CALL BACK

## 2020-09-13 ENCOUNTER — Other Ambulatory Visit (HOSPITAL_COMMUNITY): Payer: Self-pay | Admitting: Physician Assistant

## 2020-09-13 DIAGNOSIS — F411 Generalized anxiety disorder: Secondary | ICD-10-CM

## 2020-09-13 DIAGNOSIS — F4310 Post-traumatic stress disorder, unspecified: Secondary | ICD-10-CM

## 2020-09-13 MED ORDER — SERTRALINE HCL 25 MG PO TABS: 25.0000 mg | ORAL_TABLET | Freq: Every day | ORAL | 0 refills | Status: DC

## 2020-09-13 NOTE — Progress Notes (Signed)
It was determined that patient is allergic to Celexa after a trial run. Patient was recommended Sertraline (Zoloft) 25 mg for the management of his anxiety and panic attacks. Patient's medication was e-prescribed to pharmacy of choice.

## 2020-09-13 NOTE — Telephone Encounter (Signed)
Patient was contacted yesterday about his allergic reaction to Celexa 20 mg. Patient states that he took the medication in the afternoon and experienced the sensation of feeling high. Patient states that he took a benadryl to knock down the feeling he was experiencing with Celexa. While at work, patient stated that his throat started to become itchy after taking his Celexa and EMS was called after the patient experienced an anxiety attack from the allergic reaction. Patient reported that he stopped taking the Celexa yesterday. Provider informed patient that he would contact him regarding next steps in his medication management.   Patient was contacted this evening regarding next steps in his management of his anxiety and panic attacks. Patient was recommended starting Sertraline (Zoloft) 25 mg for the management of his anxiety and panic disorder. Patient was informed that his medication would be e-prescribed to pharmacy of choice. Patient was informed that writer would touch base with patient within a week to determine how his Sertraline was treating him. Patient is agreeable to the plan going forward.

## 2020-09-21 NOTE — Progress Notes (Signed)
BH MD/PA/NP OP Progress Note  Virtual Visit via Telephone Note  I connected with Dakota Williams on 09/09/2020 at  2:00 PM EST by telephone and verified that I am speaking with the correct person using two identifiers.  Location: Patient: Home Provider: Clinic   I discussed the limitations, risks, security and privacy concerns of performing an evaluation and management service by telephone and the availability of in person appointments. I also discussed with the patient that there may be a patient responsible charge related to this service. The patient expressed understanding and agreed to proceed.  Follow Up Instructions:  I discussed the assessment and treatment plan with the patient. The patient was provided an opportunity to ask questions and all were answered. The patient agreed with the plan and demonstrated an understanding of the instructions.   The patient was advised to call back or seek an in-person evaluation if the symptoms worsen or if the condition fails to improve as anticipated.  I provided 27 minutes of non-face-to-face time during this encounter.  Meta Hatchet, PA   09/09/2020 2:37 PM Dakota Williams  MRN:  [id number]  Chief Complaint: Follow up and medication management  HPI:  Dakota Williams is a 30 year old male with a past psychiatric history significant for bipolar disorder, generalized anxiety disorder, and PTSD who presents to [facility] via virtual telephone visit for follow-up and medication management.  Patient is currently being managed on the following medications:  Lamotrigine 50 mg daily Hydroxyzine 25 mg 3 times daily  Patient reports that his medication regimen has been going okay.  Patient reports that he called [facility] a few days ago and talked with the nurse about issues he has been having with his anxiety.  Patient reports that his anxiety is at an all-time high and that he has been  experiencing panic attacks after dinner.  Patient reports that his panic attacks are characterized by the following symptoms: chest tightness, numbing of the face, and throat tightness.  Patient states that it takes his hydroxyzine roughly 30 minutes to go into effect.  Patient attributes the worsening of his anxiety to significant dates within his life that are approaching.  Patient also states that whenever his mood becomes depressed, he will experience panic attacks.  Patient denies suicidal ideations, but states that when he experiences severe panic attacks, he endorses feeling a sense of hopelessness and worthlessness.  Patient denies homicidal ideations.  He further denies auditory or visual hallucinations.  Patient reports that the amount of sleep he gets is varied.  Patient reports broken sleep and states that he may only sleep 2 hours at a time.  On occasion, patient may stay up all night. Patient attributes his sleep deficits to the vivid dreams he has. Patient endorses decreased appetite due to his anxiety and panic attacks.  Patient states that he tries to stuff himself in the morning before he starts to experience his anxiety and panic attacks.  Patient denies alcohol consumption, tobacco use, and illicit drug use.   Visit Diagnosis:    ICD-10-CM   1. GAD (generalized anxiety disorder)  F41.1 DISCONTINUED: citalopram (CELEXA) 20 MG tablet  2. Bipolar 2 disorder (HCC)  F31.81 lamoTRIgine (LAMICTAL) 25 MG tablet    DISCONTINUED: citalopram (CELEXA) 20 MG tablet  3. PTSD (post-traumatic stress disorder)  F43.10 prazosin (MINIPRESS) 1 MG capsule    DISCONTINUED: citalopram (CELEXA) 20 MG tablet    Past Psychiatric History:  Bipolar Disorder PTSD  Past Medical History:  Past Medical History:  Diagnosis Date   Anxiety    Asthma    Bipolar 1 disorder (HCC)    ETOH abuse    PTSD (post-traumatic stress disorder)    No past surgical history on file.  Family Psychiatric History:   Younger Sister - Schizophrenia Mom - patient suspects she was borderline, substance abuse in the form of crack use Sister - Bipolar disorder Brother - Bipolar disorder  Family History: No family history on file.  Social History:  Social History   Socioeconomic History   Marital status: Single    Spouse name: Not on file   Number of children: Not on file   Years of education: Not on file   Highest education level: Not on file  Occupational History   Not on file  Tobacco Use   Smoking status: Former Smoker    Quit date: 04/20/2020    Years since quitting: 0.4   Smokeless tobacco: Never Used  Substance and Sexual Activity   Alcohol use: Not Currently    Comment: quit drinking 2020 of Jan 26th .    Drug use: No   Sexual activity: Not on file  Other Topics Concern   Not on file  Social History Narrative   Not on file   Social Determinants of Health   Financial Resource Strain: Medium Risk   Difficulty of Paying Living Expenses: Somewhat hard  Food Insecurity: Food Insecurity Present   Worried About Running Out of Food in the Last Year: Sometimes true   Ran Out of Food in the Last Year: Sometimes true  Transportation Needs: No Transportation Needs   Lack of Transportation (Medical): No   Lack of Transportation (Non-Medical): No  Physical Activity: Insufficiently Active   Days of Exercise per Week: 3 days   Minutes of Exercise per Session: 30 min  Stress: Stress Concern Present   Feeling of Stress : Rather much  Social Connections: Socially Isolated   Frequency of Communication with Friends and Family: Once a week   Frequency of Social Gatherings with Friends and Family: Never   Attends Religious Services: Never   Database administrator or Organizations: No   Attends Banker Meetings: Never   Marital Status: Married    Allergies:  Allergies  Allergen Reactions   Naproxen Anaphylaxis   Penicillins Shortness Of Breath and  Swelling    Has patient had a PCN reaction causing immediate rash, facial/tongue/throat swelling, SOB or lightheadedness with hypotension: Yes Has patient had a PCN reaction causing severe rash involving mucus membranes or skin necrosis: No Has patient had a PCN reaction that required hospitalization: Yes Has patient had a PCN reaction occurring within the last 10 years: No If all of the above answers are "NO", then may proceed with Cephalosporin use.   Shellfish Allergy Anaphylaxis   Vicodin [Hydrocodone-Acetaminophen] Anaphylaxis    Metabolic Disorder Labs: No results found for: HGBA1C, MPG No results found for: PROLACTIN No results found for: CHOL, TRIG, HDL, CHOLHDL, VLDL, LDLCALC No results found for: TSH  Therapeutic Level Labs: No results found for: LITHIUM No results found for: VALPROATE No components found for:  CBMZ  Current Medications: Current Outpatient Medications  Medication Sig Dispense Refill   prazosin (MINIPRESS) 1 MG capsule Take 1 capsule (1 mg total) by mouth at bedtime. 30 capsule 1   gabapentin (NEURONTIN) 400 MG capsule Take 1 capsule (400 mg total) by mouth 3 (three) times daily. 30 capsule 0  hydrOXYzine (ATARAX/VISTARIL) 25 MG tablet Take 1 tablet (25 mg total) by mouth 3 (three) times daily as needed. 90 tablet 0   ibuprofen (ADVIL,MOTRIN) 400 MG tablet Take 1 tablet (400 mg total) by mouth every 6 (six) hours as needed for moderate pain. 30 tablet 0   lamoTRIgine (LAMICTAL) 25 MG tablet Take 2 tablets (50 mg total) by mouth daily. 60 tablet 1   sertraline (ZOLOFT) 25 MG tablet Take 1 tablet (25 mg total) by mouth daily. 30 tablet 0   traZODone (DESYREL) 100 MG tablet Take 1 tablet (100 mg total) by mouth at bedtime and may repeat dose one time if needed. 30 tablet 0   No current facility-administered medications for this visit.     Musculoskeletal: Strength & Muscle Tone:  Unable to assess due to telemedicine visit Gait & Station: Unable  to assess due to telemedicine visit Patient leans: Unable to assess due to telemedicine visit  Psychiatric Specialty Exam: Review of Systems  Psychiatric/Behavioral: Positive for dysphoric mood and sleep disturbance. Negative for decreased concentration, hallucinations, self-injury and suicidal ideas. The patient is nervous/anxious. The patient is not hyperactive.     There were no vitals taken for this visit.There is no height or weight on file to calculate BMI.  General Appearance: Unable to assess due to telemedicine visit  Eye Contact:  Unable to assess due to telemedicine visit  Speech:  Clear and Coherent and Normal Rate  Volume:  Normal  Mood:  Anxious, Depressed and Dysphoric  Affect:  Congruent and Depressed  Thought Process:  Coherent, Goal Directed and Descriptions of Associations: Intact  Orientation:  Full (Time, Place, and Person)  Thought Content: WDL and Logical   Suicidal Thoughts:  No  Homicidal Thoughts:  No  Memory:  Immediate;   Good Recent;   Good Remote;   Good  Judgement:  Good  Insight:  Good  Psychomotor Activity:  Normal  Concentration:  Concentration: Good and Attention Span: Good  Recall:  Good  Fund of Knowledge: Good  Language: Good  Akathisia:  NA  Handed:  Right  AIMS (if indicated): not done  Assets:  Communication Skills Desire for Improvement Housing Physical Health Vocational/Educational  ADL's:  Intact  Cognition: WNL  Sleep:  Poor   Screenings: AIMS   Flowsheet Row [facility]  AIMS Total Score 0    AUDIT   Flowsheet Row [facility]  Alcohol Use Disorder Identification Test Final Score (AUDIT) 16    GAD-7   Flowsheet Row Counselor from 07/29/2020 in [facility]  Total GAD-7 Score 16    PHQ2-9   Flowsheet Row Counselor from 07/29/2020 in [facility]  PHQ-2 Total Score 5  PHQ-9 Total Score 14       Assessment and Plan:  Dakota Lindaman. Dakota Williams is a 29 year old male with a past psychiatric history significant for bipolar disorder, generalized anxiety disorder, and PTSD who presents to [facility] via virtual telephone visit for follow-up and medication management.  Patient reports worsening anxiety and panic attacks.  Patient attributes his worsening anxiety and panic attacks to significant dates within his past such as the death of his sister or the date that he became sober.  Patient states he also experiences increased panic attacks whenever he feels depressed.  Patient also reports intermittent sleep and states that sometimes he  stays up all night due to the vivid dreams he has. Patient was recommended Celexa 20 mg daily for the management of his anxiety. Patient was informed that Celexa is an antidepressant with efficacy in managing anxiety/panic disorder. Patient was further advised that if his panic attacks were due to depressed mood, then managing his mood would hopefully lessen the frequency of his panic attacks. Patient was agreeable to recommendation.  Patient was also recommended prazosin 1 mg at bedtime for the management of his vivid dreams.  Patient was agreeable to recommendation.  1. GAD (generalized anxiety disorder)  - citalopram (CELEXA) 20 MG tablet; Take 1 tablet (20 mg total) by mouth daily.  Dispense: 30 tablet; Refill: 1  2. Bipolar 2 disorder (HCC)  - citalopram (CELEXA) 20 MG tablet; Take 1 tablet (20 mg total) by mouth daily.  Dispense: 30 tablet; Refill: 1 - lamoTRIgine (LAMICTAL) 25 MG tablet; Take 2 tablets (50 mg total) by mouth daily.  Dispense: 60 tablet; Refill: 1  3. PTSD (post-traumatic stress disorder)  - prazosin (MINIPRESS) 1 MG capsule; Take 1 capsule (1 mg total) by mouth at bedtime.  Dispense: 30 capsule; Refill: 1  Patient to  follow up in 4 weeks Meta Hatchet, PA 09/09/2020, 2:37 PM

## 2020-09-22 ENCOUNTER — Encounter (HOSPITAL_COMMUNITY): Payer: Self-pay | Admitting: Physician Assistant

## 2020-09-23 ENCOUNTER — Telehealth (HOSPITAL_COMMUNITY): Payer: Self-pay | Admitting: *Deleted

## 2020-09-23 ENCOUNTER — Other Ambulatory Visit (HOSPITAL_COMMUNITY): Payer: Self-pay | Admitting: Physician Assistant

## 2020-09-23 DIAGNOSIS — F411 Generalized anxiety disorder: Secondary | ICD-10-CM

## 2020-09-23 MED ORDER — HYDROXYZINE HCL 25 MG PO TABS: 25.0000 mg | ORAL_TABLET | Freq: Three times a day (TID) | ORAL | 0 refills | Status: DC | PRN

## 2020-09-23 NOTE — Telephone Encounter (Signed)
Provider was contacted by Rushie Chestnut, RMA regarding medication refills. Patient's medications will be e-prescribed to pharmacy of choice.

## 2020-09-23 NOTE — Telephone Encounter (Signed)
[facility] faxed refill request  hydrOXYzine (ATARAX/VISTARIL) 25 MG tablet 90 tablet 0 08/28/2020

## 2020-09-23 NOTE — Progress Notes (Signed)
Provider was contacted by Direce E McIntyre, RMA regarding medication refills. Patient's medications will be e-prescribed to pharmacy of choice.

## 2020-10-12 ENCOUNTER — Ambulatory Visit (HOSPITAL_COMMUNITY): Payer: No Payment, Other | Admitting: Licensed Clinical Social Worker

## 2020-10-19 ENCOUNTER — Encounter (HOSPITAL_COMMUNITY): Payer: Self-pay | Admitting: Physician Assistant

## 2020-10-19 ENCOUNTER — Telehealth (INDEPENDENT_AMBULATORY_CARE_PROVIDER_SITE_OTHER): Payer: No Payment, Other | Admitting: Physician Assistant

## 2020-10-19 ENCOUNTER — Other Ambulatory Visit: Payer: Self-pay

## 2020-10-19 DIAGNOSIS — F4310 Post-traumatic stress disorder, unspecified: Secondary | ICD-10-CM

## 2020-10-19 DIAGNOSIS — F3181 Bipolar II disorder: Secondary | ICD-10-CM | POA: Diagnosis not present

## 2020-10-19 DIAGNOSIS — F410 Panic disorder [episodic paroxysmal anxiety] without agoraphobia: Secondary | ICD-10-CM

## 2020-10-19 DIAGNOSIS — F411 Generalized anxiety disorder: Secondary | ICD-10-CM

## 2020-10-19 MED ORDER — SERTRALINE HCL 25 MG PO TABS: 25.0000 mg | ORAL_TABLET | Freq: Every day | ORAL | 2 refills | Status: DC

## 2020-10-19 MED ORDER — LAMOTRIGINE 25 MG PO TABS: 50.0000 mg | ORAL_TABLET | Freq: Every day | ORAL | 2 refills | Status: DC

## 2020-10-19 MED ORDER — PRAZOSIN HCL 1 MG PO CAPS: 1.0000 mg | ORAL_CAPSULE | Freq: Every day | ORAL | 2 refills | Status: DC

## 2020-10-19 MED ORDER — HYDROXYZINE HCL 25 MG PO TABS: 25.0000 mg | ORAL_TABLET | Freq: Three times a day (TID) | ORAL | 1 refills | Status: DC | PRN
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
## 2020-11-04 ENCOUNTER — Other Ambulatory Visit: Payer: Self-pay

## 2020-11-04 ENCOUNTER — Ambulatory Visit (INDEPENDENT_AMBULATORY_CARE_PROVIDER_SITE_OTHER): Payer: BC Managed Care – PPO | Admitting: Licensed Clinical Social Worker

## 2020-11-04 DIAGNOSIS — F4310 Post-traumatic stress disorder, unspecified: Secondary | ICD-10-CM

## 2020-11-04 DIAGNOSIS — F3181 Bipolar II disorder: Secondary | ICD-10-CM

## 2020-11-04 DIAGNOSIS — F411 Generalized anxiety disorder: Secondary | ICD-10-CM
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
## 2020-12-01 ENCOUNTER — Other Ambulatory Visit: Payer: Self-pay

## 2020-12-01 ENCOUNTER — Telehealth (HOSPITAL_COMMUNITY): Payer: No Payment, Other | Admitting: Physician Assistant

## 2020-12-06 ENCOUNTER — Ambulatory Visit (INDEPENDENT_AMBULATORY_CARE_PROVIDER_SITE_OTHER): Payer: No Payment, Other | Admitting: Licensed Clinical Social Worker

## 2020-12-06 ENCOUNTER — Other Ambulatory Visit: Payer: Self-pay

## 2020-12-06 DIAGNOSIS — F411 Generalized anxiety disorder: Secondary | ICD-10-CM | POA: Diagnosis not present

## 2020-12-06 DIAGNOSIS — F3181 Bipolar II disorder: Secondary | ICD-10-CM

## 2020-12-06 NOTE — Progress Notes (Signed)
   THERAPIST PROGRESS NOTE  Session Time: 30  Participation Level: Active  Behavioral Response: CasualAlertEuthymic  Type of Therapy: Individual Therapy  Treatment Goals addressed: Diagnosis: Major depression and GAD   Interventions: CBT and Supportive  Summary: Dakota Williams is a 30 y.o. male who presents with Major depression.    Suicidal/Homicidal: NAwithout intent/plan   Virtual Visit via Video Note  I connected with Dakota Williams on 12/06/20 at  4:00 PM EDT by a video enabled telemedicine application and verified that I am speaking with the correct person using two identifiers.  Location: Patient: [facility]  Provider: [facility]   I discussed the limitations of evaluation and management by telemedicine and the availability of in person appointments. The patient expressed understanding and agreed to proceed.  Therapist Response:     Subjective/Objective:  Pt was alert and oriented x 5. He was dressed casually and engaged well in therapy. Pt presented with euthymic mood/affect. He was cooperative and maintained good eye contact.   Primary stressor was work for pt prior today's session. 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 [name] reports improvement in tension, worry, and restlessness. He reports panic attacks & mood swing declined from 3 to 5 per week to 1 to 2 times per week.    Assessment/Plan: Pt endorse symptoms for nightmares/dreams, anxiety attack 1 x weekly, mood swings 1 x weekly, and fatigue. He does still meet criteria for bipolar 2 and GAD. He is taking his medications as prescribed. Plan for pt is to take dreams write the initial narrative of the dream on paper then re write the dream and the way he wishes it happened. This is to help pt control the narrative  instead of him letting it control him.     I discussed the assessment and treatment plan with the patient. The patient was provided an opportunity to ask questions and all were answered. The patient agreed with the plan and demonstrated an understanding of the instructions.   The patient was advised to call back or seek an in-person evaluation if the symptoms worsen or if the condition fails to improve as anticipated.  I provided 45 minutes of non-face-to-face time during this encounter.   [name], LCSW   Plan: Return again in 4 weeks.     [name], LCSW 12/06/2020

## 2020-12-08 ENCOUNTER — Encounter (HOSPITAL_COMMUNITY): Payer: Self-pay | Admitting: Physician Assistant

## 2020-12-08 ENCOUNTER — Other Ambulatory Visit: Payer: Self-pay

## 2020-12-08 ENCOUNTER — Telehealth (INDEPENDENT_AMBULATORY_CARE_PROVIDER_SITE_OTHER): Payer: No Payment, Other | Admitting: Physician Assistant

## 2020-12-08 DIAGNOSIS — F411 Generalized anxiety disorder: Secondary | ICD-10-CM | POA: Diagnosis not present

## 2020-12-08 DIAGNOSIS — F410 Panic disorder [episodic paroxysmal anxiety] without agoraphobia: Secondary | ICD-10-CM

## 2020-12-08 DIAGNOSIS — F4310 Post-traumatic stress disorder, unspecified: Secondary | ICD-10-CM

## 2020-12-08 DIAGNOSIS — F3181 Bipolar II disorder: Secondary | ICD-10-CM | POA: Diagnosis not present

## 2020-12-08 NOTE — Progress Notes (Signed)
BH MD/PA/NP OP Progress Note  Virtual Visit via Telephone Note  I connected with Dakota Williams on 12/08/20 at  4:30 PM EDT by telephone and verified that I am speaking with the correct person using two identifiers.  Location: Patient: Home Provider: Clinic   I discussed the limitations, risks, security and privacy concerns of performing an evaluation and management service by telephone and the availability of in person appointments. I also discussed with the patient that there may be a patient responsible charge related to this service. The patient expressed understanding and agreed to proceed.  Follow Up Instructions:  I discussed the assessment and treatment plan with the patient. The patient was provided an opportunity to ask questions and all were answered. The patient agreed with the plan and demonstrated an understanding of the instructions.   The patient was advised to call back or seek an in-person evaluation if the symptoms worsen or if the condition fails to improve as anticipated.  I provided 25 minutes of non-face-to-face time during this encounter.  Meta Hatchet, PA   12/08/2020 6:32 PM Dakota Williams  MRN:  [id number]  Chief Complaint: Follow up and medication management  HPI:   Dakota Williams is a 30 year old male with a past psychiatric history significant for generalized anxiety disorder, bipolar 2 disorder, PTSD, and panic disorder who presents to [facility] via virtual telephone visit for follow-up and medication management.  Patient is currently being managed on the following medications:  Hydroxyzine 25 mg 3 times daily as needed Sertraline 25 mg daily Prazosin 1 mg at bedtime Lamotrigine 25 mg 2 times daily  Patient reports no issues or concerns with his current regimen of medications.  Patient expresses that he is still experiencing waking up from bizarre nightmares 2 times per week.  Upon waking,  patient reports that he usually experiences a panic attack.  Patient states that his panic attacks are characterized by difficulty breathing and hyperventilating and reports that they occur on random days.  Patient is unsure why he is still experiencing panic attacks at night but states that his prazosin has been somewhat helpful in the management of his nightmares.  Despite experiencing his panic attacks, patient has been doing generally well.  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  Patient denies experiencing any new stressors since acquiring the position.  Patient is pleasant, calm, cooperative, and fully engaged in conversation during the encounter.  Patient reports that he is in a comfortable spot and feels very relaxed at this time.  Patient denies suicidal or homicidal ideations.  He further denies auditory or visual hallucinations and does not appear to be responding to internal/external stimuli.  Patient reports that he received on average 6 to 8 hours of sleep each night.  Patient endorses good appetite and eats on average 3 meals per day.  Patient reports that he has been eating regularly and has been gaining healthy weight. Patient denies alcohol consumption, tobacco use, and illicit drug use.  Visit Diagnosis:    ICD-10-CM   1. GAD (generalized anxiety disorder)  F41.1   2. Bipolar 2 disorder (HCC)  F31.81   3. PTSD (post-traumatic stress disorder)  F43.10   4. Panic disorder  F41.0     Past Psychiatric History: Bipolar II disorder PTSD Generalized anxiety disorder Panic disorder  Past Medical History:  Past Medical History:  Diagnosis Date  . Anxiety   .  Asthma   . Bipolar 1 disorder (HCC)   . ETOH abuse   . PTSD (post-traumatic stress disorder)    History reviewed. No pertinent surgical history.  Family Psychiatric History:  Younger Sister - Schizophrenia Mom - patient suspects she was borderline, substance  abuse in the form of crack use Sister - Bipolar disorder Brother - Bipolar disorder  Family History: History reviewed. No pertinent family history.  Social History:  Social History   Socioeconomic History  . Marital status: Single    Spouse name: Not on file  . Number of children: Not on file  . Years of education: Not on file  . Highest education level: Not on file  Occupational History  . Not on file  Tobacco Use  . Smoking status: Former Smoker    Quit date: 04/20/2020    Years since quitting: 0.6  . Smokeless tobacco: Never Used  Substance and Sexual Activity  . Alcohol use: Not Currently    Comment: quit drinking 2020 of Jan 26th .   . Drug use: No  . Sexual activity: Not on file  Other Topics Concern  . Not on file  Social History Narrative  . Not on file   Social Determinants of Health   Financial Resource Strain: Medium Risk  . Difficulty of Paying Living Expenses: Somewhat hard  Food Insecurity: Food Insecurity Present  . Worried About Programme researcher, broadcasting/film/video in the Last Year: Sometimes true  . Ran Out of Food in the Last Year: Sometimes true  Transportation Needs: No Transportation Needs  . Lack of Transportation (Medical): No  . Lack of Transportation (Non-Medical): No  Physical Activity: Insufficiently Active  . Days of Exercise per Week: 3 days  . Minutes of Exercise per Session: 30 min  Stress: Stress Concern Present  . Feeling of Stress : Rather much  Social Connections: Socially Isolated  . Frequency of Communication with Friends and Family: Once a week  . Frequency of Social Gatherings with Friends and Family: Never  . Attends Religious Services: Never  . Active Member of Clubs or Organizations: No  . Attends Banker Meetings: Never  . Marital Status: Married    Allergies:  Allergies  Allergen Reactions  . Naproxen Anaphylaxis  . Penicillins Shortness Of Breath and Swelling    Has patient had a PCN reaction causing immediate rash,  facial/tongue/throat swelling, SOB or lightheadedness with hypotension: Yes Has patient had a PCN reaction causing severe rash involving mucus membranes or skin necrosis: No Has patient had a PCN reaction that required hospitalization: Yes Has patient had a PCN reaction occurring within the last 10 years: No If all of the above answers are "NO", then may proceed with Cephalosporin use.  . Shellfish Allergy Anaphylaxis  . Vicodin [Hydrocodone-Acetaminophen] Anaphylaxis    Metabolic Disorder Labs: No results found for: HGBA1C, MPG No results found for: PROLACTIN No results found for: CHOL, TRIG, HDL, CHOLHDL, VLDL, LDLCALC No results found for: TSH  Therapeutic Level Labs: No results found for: LITHIUM No results found for: VALPROATE No components found for:  CBMZ  Current Medications: Current Outpatient Medications  Medication Sig Dispense Refill  . gabapentin (NEURONTIN) 400 MG capsule Take 1 capsule (400 mg total) by mouth 3 (three) times daily. 30 capsule 0  . hydrOXYzine (ATARAX/VISTARIL) 25 MG tablet Take 1 tablet (25 mg total) by mouth 3 (three) times daily as needed for anxiety. 90 tablet 1  . ibuprofen (ADVIL,MOTRIN) 400 MG tablet Take 1 tablet (400  mg total) by mouth every 6 (six) hours as needed for moderate pain. 30 tablet 0  . lamoTRIgine (LAMICTAL) 25 MG tablet Take 2 tablets (50 mg total) by mouth daily. 60 tablet 2  . prazosin (MINIPRESS) 1 MG capsule Take 1 capsule (1 mg total) by mouth at bedtime. 30 capsule 2  . sertraline (ZOLOFT) 25 MG tablet Take 1 tablet (25 mg total) by mouth daily. 30 tablet 2  . traZODone (DESYREL) 100 MG tablet Take 1 tablet (100 mg total) by mouth at bedtime and may repeat dose one time if needed. 30 tablet 0   No current facility-administered medications for this visit.     Musculoskeletal: Strength & Muscle Tone: Unable to assess due to telemedicine visit Gait & Station: Unable to assess due to telemedicine visit Patient leans: Unable  to assess due to telemedicine visit  Psychiatric Specialty Exam: Review of Systems  Psychiatric/Behavioral: Positive for sleep disturbance. Negative for decreased concentration, dysphoric mood, hallucinations, self-injury and suicidal ideas. The patient is nervous/anxious. The patient is not hyperactive.     There were no vitals taken for this visit.There is no height or weight on file to calculate BMI.  General Appearance:Unable to assess due to telemedicine visit   Eye Contact:  Unable to assess due to telemedicine visit  Speech:  Clear and Coherent and Normal Rate  Volume:  Normal  Mood:  Anxious and Euthymic  Affect:  Appropriate and Congruent  Thought Process:  Coherent, Goal Directed and Descriptions of Associations: Intact  Orientation:  Full (Time, Place, and Person)  Thought Content: WDL   Suicidal Thoughts:  No  Homicidal Thoughts:  No  Memory:  Immediate;   Good Recent;   Good Remote;   Good  Judgement:  Good  Insight:  Good  Psychomotor Activity:  Normal  Concentration:  Concentration: Good and Attention Span: Good  Recall:  Good  Fund of Knowledge: Good  Language: Good  Akathisia:  NA  Handed:  Right  AIMS (if indicated): not done  Assets:  Communication Skills Desire for Improvement Housing Physical Health Vocational/Educational  ADL's:  Intact  Cognition: WNL  Sleep:  Good   Screenings: AIMS   Flowsheet Row [facility]  AIMS Total Score 0    AUDIT   Flowsheet Row [facility]  Alcohol Use Disorder Identification Test Final Score (AUDIT) 16    GAD-7   Flow[facility]  Total GAD-7 Score 3 9 16      PHQ2-9   [facility]  PHQ-2 Total Score 0 5 5  PHQ-9 Total Score -- 11 14    Flowsheet Row [facility]  C-SSRS RISK CATEGORY No Risk No Risk High Risk       Assessment and Plan:   Jari FavreSteven M. Clinton Williams is a 30 year old male with a past psychiatric history significant for generalized anxiety disorder, bipolar 2 disorder, PTSD, and panic disorder who  presents to [facility] via virtual telephone visit for follow-up and medication management.  Patient reports that he is experiencing panic attacks triggered by nightmares he experiences at least 2 times a week.  Patient was recommended increasing his dosage of prazosin from 1 mg to 2 mg to see if his nightmares subside.  Patient expressed that he may test out the effects of 2 mg of prazosin on a night where he does not have work the next day just to make sure that the prazosin does not affect his sleep too much.  Patient denies a need for medication refills at this time.  Patient to continue taking his medications as scheduled.  1. GAD (generalized anxiety disorder) Patient to continue taking hydroxyzine 25 mg 3 times daily as needed for the management of his generalized anxiety disorder Patient to continue taking sertraline 25 mg daily for the management of his generalized anxiety disorder  2. Bipolar 2 disorder (HCC) Patient to continue taking lamotrigine 25 mg 2 times daily for the management of his bipolar 2 disorder Patient to continue taking sertraline 25 mg daily for the management  of his bipolar 2 disorder  3. PTSD (post-traumatic stress disorder) Patient was encouraged to take prazosin 2 mg daily to see if his nightmares become more managed  4. Panic disorder Patient to continue taking sertraline 25 mg daily for the management of his panic disorder  Patient to follow up in 2 months  Meta Hatchet, PA 12/08/2020, 6:32 PM

## 2021-01-06 ENCOUNTER — Ambulatory Visit (HOSPITAL_COMMUNITY): Payer: No Payment, Other | Admitting: Licensed Clinical Social Worker

## 2021-01-06 ENCOUNTER — Telehealth (HOSPITAL_COMMUNITY): Payer: Self-pay | Admitting: Licensed Clinical Social Worker

## 2021-01-06 ENCOUNTER — Other Ambulatory Visit: Payer: Self-pay

## 2021-01-06 NOTE — Telephone Encounter (Signed)
Pt sent link for 8 am appointment today. He asked to reschedule as he got called into work. Dakota Williams rescheduled for next week Friday 9am.

## 2021-01-13 ENCOUNTER — Other Ambulatory Visit: Payer: Self-pay

## 2021-01-13 ENCOUNTER — Ambulatory Visit (INDEPENDENT_AMBULATORY_CARE_PROVIDER_SITE_OTHER): Payer: No Payment, Other | Admitting: Licensed Clinical Social Worker

## 2021-01-13 DIAGNOSIS — F4310 Post-traumatic stress disorder, unspecified: Secondary | ICD-10-CM

## 2021-01-13 DIAGNOSIS — F3181 Bipolar II disorder: Secondary | ICD-10-CM

## 2021-01-13 DIAGNOSIS — F411 Generalized anxiety disorder: Secondary | ICD-10-CM

## 2021-01-13 NOTE — Progress Notes (Signed)
   THERAPIST PROGRESS NOTE  Session Time: 39  Virtual Visit via Video Note  I connected with Dakota Williams on 01/13/21 at  9:00 AM EDT by a video enabled telemedicine application and verified that I am speaking with the correct person using two identifiers.  Location: Patient: Dakota Williams  Provider: Home    I discussed the limitations of evaluation and management by telemedicine and the availability of in person appointments. The patient expressed understanding and agreed to proceed.     I discussed the assessment and treatment plan with the patient. The patient was provided an opportunity to ask questions and all were answered. The patient agreed with the plan and demonstrated an understanding of the instructions.   The patient was advised to call back or seek an in-person evaluation if the symptoms worsen or if the condition fails to improve as anticipated.  I provided 45 minutes of non-face-to-face time during this encounter.   Dakota Cooks, LCSW   Participation Level: Active  Behavioral Response: CasualAlertAnxious  Type of Therapy: Individual Therapy  Treatment Goals addressed: Diagnosis: GAD, PTSD, bipolar 2 disorder   Interventions: CBT and Supportive  Summary: Dakota Williams is a 30 y.o. male who presents with bipolar disorder and Major depression, PTSD.   Suicidal/Homicidal: NAwithout intent/plan  Therapist Response:    Subjective/Objective: Pt was alert and oriented x 5. He was dressed casually and engaged well in therapy session. Pt presented with anxious and irritable mood/affect. He was cooperative and maintained good eye contact.   Primary stressor for pt is work. He states things started off well at his new job. Lately people have been saying things to him that have been inappropriate. Dakota Williams does not feel that his Education officer, environmental handle these conflicts well. Pt states he feels like there should be more being done such as  sending the problems home for the day. He also states tension due to his rate of pay. Everyone else got a cost-of-living increase, but he did not.   Plan: Pt to write pro and cons list for current job. Dakota Williams to stick out 4 more weeks at current job. If things do not get better than he may consider looking into alternatives. Dakota Williams to come up with back up job to help relieve financial stress if he does decide to quit. Dakota Williams is agreeable to plan.    Assessment/Plan: Pt endorses symptoms for anxiety for tension, worry, and irritability. He does have Dx for GAD, and bipolar disorder. He is taking his medications as prescribed. Dakota Williams will f/u with LCSW in 4 weeks.    Plan: Return again in 4 weeks.     Dakota Cooks, LCSW 01/13/2021

## 2021-02-07 ENCOUNTER — Ambulatory Visit (INDEPENDENT_AMBULATORY_CARE_PROVIDER_SITE_OTHER): Payer: No Payment, Other | Admitting: Physician Assistant

## 2021-02-07 ENCOUNTER — Encounter (HOSPITAL_COMMUNITY): Payer: Self-pay | Admitting: Physician Assistant

## 2021-02-07 ENCOUNTER — Other Ambulatory Visit: Payer: Self-pay

## 2021-02-07 DIAGNOSIS — F411 Generalized anxiety disorder: Secondary | ICD-10-CM

## 2021-02-07 DIAGNOSIS — F410 Panic disorder [episodic paroxysmal anxiety] without agoraphobia: Secondary | ICD-10-CM | POA: Diagnosis not present

## 2021-02-07 DIAGNOSIS — F4310 Post-traumatic stress disorder, unspecified: Secondary | ICD-10-CM

## 2021-02-07 DIAGNOSIS — F3181 Bipolar II disorder: Secondary | ICD-10-CM | POA: Diagnosis not present

## 2021-02-07 MED ORDER — SERTRALINE HCL 25 MG PO TABS: 25.0000 mg | ORAL_TABLET | Freq: Every day | ORAL | 2 refills | Status: AC

## 2021-02-07 MED ORDER — PRAZOSIN HCL 1 MG PO CAPS: 1.0000 mg | ORAL_CAPSULE | Freq: Every day | ORAL | 2 refills | Status: AC

## 2021-02-07 MED ORDER — LAMOTRIGINE 25 MG PO TABS: 50.0000 mg | ORAL_TABLET | Freq: Every day | ORAL | 2 refills | Status: AC

## 2021-02-07 MED ORDER — HYDROXYZINE HCL 25 MG PO TABS: 25.0000 mg | ORAL_TABLET | Freq: Three times a day (TID) | ORAL | 1 refills | Status: AC | PRN

## 2021-02-07 NOTE — Progress Notes (Signed)
BH MD/PA/NP OP Progress Note  02/07/2021 6:07 PM Dakota Williams  MRN:  [id number]  Chief Complaint:  Chief Complaint   Medication Management    HPI:   Dakota Williams is a 30 year old male with a past psychiatric history significant for PTSD, generalized anxiety disorder, panic disorder, and bipolar II disorder who presents to [facility] for follow-up and medication management.  Patient is currently being managed on the following medications:  Hydroxyzine 25 mg 3 times daily as needed Sertraline 25 mg daily Lamotrigine 25 mg 2 times daily Prazosin 1 mg at bedtime  Patient reports no issues or concerns regarding his current medication regimen.  Patient denies the need for dosage adjustments at this time and is requesting refills on all his medications following the conclusion of the encounter.  Patient reports that he is down to having 1 panic attack per week.  Patient's panic attack is characterized by nervousness, vulnerability, feeling weak, difficulty breathing, and feeling paranoid.  Patient expresses that his anxiety has been manageable especially when trying to chill.  Patient denies major depressive symptoms but expresses some irritability he attributes to interpersonal conflicts at work.  Patient states that there is an employee at work that continues to aggravate and provoke him.  Patient denies being physically confrontational with the employee but in the process, patient felt as if he blacked out slightly after refusing to physically confront the employee. Patient states that he couldn't stop from getting self-emotional.  A GAD-7 screen was performed today with the patient scoring a 4.  Patient is pleasant, calm, cooperative, and fully engaged in conversation during the encounter.  In response to questions regarding his mood, patient confidently reports "I've been floating." He expresses that the past 2 weeks at work have been the  best ever since the employee he is having issues with has not been present.  Patient denies suicidal ideations.  Patient endorses homicidal ideations with no intent or specific plan.  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  Patient denies auditory or visual hallucinations.  Patient expresses some sleep disturbances he describes as fighting demons in his dreams.  Patient denies his sleep disturbances being a major issues and states that he just continues to push through it.  Patient endorses fair appetite and eats at least 1 full course of meal a day.  Patient denies alcohol consumption, tobacco use, and illicit drug use.  Visit Diagnosis:    ICD-10-CM   1. PTSD (post-traumatic stress disorder)  F43.10 prazosin (MINIPRESS) 1 MG capsule    sertraline (ZOLOFT) 25 MG tablet    2. GAD (generalized anxiety disorder)  F41.1 sertraline (ZOLOFT) 25 MG tablet    hydrOXYzine (ATARAX/VISTARIL) 25 MG tablet    3. Panic disorder  F41.0 sertraline (ZOLOFT) 25 MG tablet    4. Bipolar 2 disorder (HCC)  F31.81 lamoTRIgine (LAMICTAL) 25 MG tablet      Past Psychiatric History:  Bipolar II disorder PTSD Generalized anxiety disorder Panic disorder  Past Medical History:  Past Medical History:  Diagnosis Date   Anxiety    Asthma    Bipolar 1 disorder (HCC)    ETOH abuse    PTSD (post-traumatic stress disorder)    History reviewed. No pertinent surgical history.  Family Psychiatric History:  Younger Sister - Schizophrenia Mom - patient suspects she was borderline, substance abuse in the form of crack use Sister - Bipolar disorder Brother - Bipolar disorder  Family History: History reviewed. No  pertinent family history.  Social History:  Social History   Socioeconomic History   Marital status: Single    Spouse name: Not on file   Number of children: Not on file   Years of education: Not on file   Highest education level: Not on file  Occupational History   Not on  file  Tobacco Use   Smoking status: Former    Pack years: 0.00    Types: Cigarettes    Quit date: 04/20/2020    Years since quitting: 0.8   Smokeless tobacco: Never  Substance and Sexual Activity   Alcohol use: Not Currently    Comment: quit drinking 2020 of Jan 26th .    Drug use: No   Sexual activity: Not on file  Other Topics Concern   Not on file  Social History Narrative   Not on file   Social Determinants of Health   Financial Resource Strain: Medium Risk   Difficulty of Paying Living Expenses: Somewhat hard  Food Insecurity: Food Insecurity Present   Worried About Running Out of Food in the Last Year: Sometimes true   Ran Out of Food in the Last Year: Sometimes true  Transportation Needs: No Transportation Needs   Lack of Transportation (Medical): No   Lack of Transportation (Non-Medical): No  Physical Activity: Insufficiently Active   Days of Exercise per Week: 3 days   Minutes of Exercise per Session: 30 min  Stress: Stress Concern Present   Feeling of Stress : Rather much  Social Connections: Socially Isolated   Frequency of Communication with Friends and Family: Once a week   Frequency of Social Gatherings with Friends and Family: Never   Attends Religious Services: Never   Database administrator or Organizations: No   Attends Banker Meetings: Never   Marital Status: Married    Allergies:  Allergies  Allergen Reactions   Naproxen Anaphylaxis   Penicillins Shortness Of Breath and Swelling    Has patient had a PCN reaction causing immediate rash, facial/tongue/throat swelling, SOB or lightheadedness with hypotension: Yes Has patient had a PCN reaction causing severe rash involving mucus membranes or skin necrosis: No Has patient had a PCN reaction that required hospitalization: Yes Has patient had a PCN reaction occurring within the last 10 years: No If all of the above answers are "NO", then may proceed with Cephalosporin use.   Shellfish  Allergy Anaphylaxis   Vicodin [Hydrocodone-Acetaminophen] Anaphylaxis    Metabolic Disorder Labs: No results found for: HGBA1C, MPG No results found for: PROLACTIN No results found for: CHOL, TRIG, HDL, CHOLHDL, VLDL, LDLCALC No results found for: TSH  Therapeutic Level Labs: No results found for: LITHIUM No results found for: VALPROATE No components found for:  CBMZ  Current Medications: Current Outpatient Medications  Medication Sig Dispense Refill   gabapentin (NEURONTIN) 400 MG capsule Take 1 capsule (400 mg total) by mouth 3 (three) times daily. 30 capsule 0   ibuprofen (ADVIL,MOTRIN) 400 MG tablet Take 1 tablet (400 mg total) by mouth every 6 (six) hours as needed for moderate pain. 30 tablet 0   traZODone (DESYREL) 100 MG tablet Take 1 tablet (100 mg total) by mouth at bedtime and may repeat dose one time if needed. 30 tablet 0   hydrOXYzine (ATARAX/VISTARIL) 25 MG tablet Take 1 tablet (25 mg total) by mouth 3 (three) times daily as needed for anxiety. 75 tablet 1   lamoTRIgine (LAMICTAL) 25 MG tablet Take 2 tablets (50 mg total) by  mouth daily. 60 tablet 2   prazosin (MINIPRESS) 1 MG capsule Take 1 capsule (1 mg total) by mouth at bedtime. 30 capsule 2   sertraline (ZOLOFT) 25 MG tablet Take 1 tablet (25 mg total) by mouth daily. 30 tablet 2   No current facility-administered medications for this visit.     Musculoskeletal: Strength & Muscle Tone: within normal limits Gait & Station: normal Patient leans: N/A  Psychiatric Specialty Exam: Review of Systems  Psychiatric/Behavioral:  Positive for sleep disturbance. Negative for decreased concentration, dysphoric mood, hallucinations, self-injury and suicidal ideas. The patient is nervous/anxious. The patient is not hyperactive.    Blood pressure 125/75, pulse 89, height 6\' 3"  (1.905 m), weight 195 lb (88.5 kg).Body mass index is 24.37 kg/m.  General Appearance: Well Groomed  Eye Contact:  Good  Speech:  Clear and  Coherent and Normal Rate  Volume:  Normal  Mood:  Anxious and Euthymic  Affect:  Appropriate and Congruent  Thought Process:  Coherent and Descriptions of Associations: Intact  Orientation:  Full (Time, Place, and Person)  Thought Content: WDL   Suicidal Thoughts:  No  Homicidal Thoughts:  No  Memory:  Immediate;   Good Recent;   Good Remote;   Good  Judgement:  Good  Insight:  Good  Psychomotor Activity:  Normal  Concentration:  Concentration: Good and Attention Span: Good  Recall:  Good  Fund of Knowledge: Good  Language: Good  Akathisia:  NA  Handed:  Right  AIMS (if indicated): not done  Assets:  Communication Skills Desire for Improvement Financial Resources/Insurance Housing Physical Health Vocational/Educational  ADL's:  Intact  Cognition: WNL  Sleep:  Fair   Screenings: AIMS    Flowsheet Row [facility]  AIMS Total Score 0      AUDIT    Flowsheet Row [facility]  Alcohol Use Disorder Identification Test Final Score (AUDIT) 16      GAD-7    Flowsheet Row Office Visit from 02/07/2021 in [facility] Video Visit from 12/08/2020 in [facility] Video Visit from 10/19/2020 in [facility] Counselor from 07/29/2020 in [facility]  Total GAD-7 Score 4 3 9 16       PHQ2-9    Flowsheet Row Office Visit from 02/07/2021 in [facility] Video Visit from 12/08/2020 in [facility] Video Visit from 10/19/2020 in [facility] Counselor from 07/29/2020 in [facility]  PHQ-2 Total Score 1 0 5 5  PHQ-9 Total Score -- -- 11 14      Flowsheet Row Office Visit from 02/07/2021 in [facility] Video Visit from 12/08/2020 in [facility] Video Visit from 10/19/2020 in [facility]  C-SSRS RISK CATEGORY Low Risk No Risk No Risk        Assessment and Plan:   Dakota Williams is a 30 year old male with a past psychiatric history significant for PTSD, generalized anxiety disorder, panic disorder, and bipolar II disorder who presents to [facility] for follow-up and medication management. Patient reports that his anxiety has been manageable and denies any major depressive symptoms except for irritability he attributes to interpersonal conflicts between an employee at work. Patient reports no issues or concerns regarding his  current medication regimen.  Patient denies the need for dosage adjustments at this time and is requesting refills on medications following the conclusion of the encounter.  Patient's medications to be e-prescribed to pharmacy of choice.  1. PTSD (post-traumatic stress disorder)  - prazosin (MINIPRESS) 1 MG capsule; Take 1 capsule (1 mg total) by mouth at bedtime.  Dispense: 30 capsule; Refill: 2 - sertraline (ZOLOFT) 25 MG tablet; Take 1 tablet (25 mg total) by mouth daily.  Dispense: 30 tablet; Refill: 2  2. GAD (generalized anxiety disorder)  - sertraline (ZOLOFT) 25 MG tablet; Take 1 tablet (25 mg total) by mouth daily.  Dispense: 30 tablet; Refill: 2 - hydrOXYzine (ATARAX/VISTARIL) 25 MG tablet; Take 1 tablet (25 mg total) by mouth 3 (three) times daily as needed for anxiety.  Dispense: 75 tablet; Refill: 1  3. Panic disorder  - sertraline (ZOLOFT) 25 MG tablet; Take 1 tablet (25 mg total) by mouth daily.  Dispense: 30 tablet; Refill: 2  4. Bipolar 2 disorder (HCC)  - lamoTRIgine (LAMICTAL) 25 MG tablet; Take 2 tablets (50 mg total) by mouth daily.  Dispense: 60 tablet; Refill: 2  Patient to follow-up in 2 months Provider spent a total of 25  minutes with the patient/reviewing the patient's chart  Meta HatchetUchenna E Raylynne Cubbage, PA 02/07/2021, 6:07 PM

## 2021-02-08 ENCOUNTER — Ambulatory Visit (HOSPITAL_COMMUNITY): Payer: No Payment, Other | Admitting: Licensed Clinical Social Worker

## 2021-02-08 DIAGNOSIS — F322 Major depressive disorder, single episode, severe without psychotic features: Secondary | ICD-10-CM

## 2021-02-08 NOTE — Progress Notes (Signed)
   THERAPIST PROGRESS NOTE  Session Time: 32  Virtual Visit via Video Note  I connected with Dakota Williams on 02/08/21 at  2:00 PM EDT by a video enabled telemedicine application and verified that I am speaking with the correct person using two identifiers.  Location: Patient: Dakota Williams  Provider: [facility]    I discussed the limitations of evaluation and management by telemedicine and the availability of in person appointments. The patient expressed understanding and agreed to proceed.     I discussed the assessment and treatment plan with the patient. The patient was provided an opportunity to ask questions and all were answered. The patient agreed with the plan and demonstrated an understanding of the instructions.   The patient was advised to call back or seek an in-person evaluation if the symptoms worsen or if the condition fails to improve as anticipated.  I provided 45 minutes of non-face-to-face time during this encounter.   [name], LCSW   Participation Level: Active  Behavioral Response: CasualAlertAnxious and Depressed  Type of Therapy: Individual Therapy  Treatment Goals addressed: Anxiety  Interventions: CBT and Supportive  Summary: Dakota Williams is a 30 y.o. male who presents with .   Suicidal/Homicidal: NAwithout intent/plan  Therapist Response:    Subjective/Objective: Pt was alert and oriented x 5. He was dressed casually and engaged well in therapy session. Pt presented with irritable and anxious mood/affect.   LCSW sent two links to pt phone, after no response by pt LCSW f/u with PC. Pt did answer and stated that he would connect. Dakota Williams connected and started out session at work. He then walked to the parking lot. While walking pt stated that he was upset. His significant other and him had a disagreement. The disagreement was due to the passing of his significant others grandma spouse. Dakota Williams does not like this  person, nor did he want to grieve his death. The reason for this is since significant other grandma spouse questioned his "Manhood" because Nell drinks nonalcoholic beer.   As LCSW and pt were discussing the matter LCSW noticed that pt started driving. LCSW stopped session and asked for pt to pull over. Dakota Williams asked why. LCSW explained that assessment cannot be conducted while operating a motor vehicle. Dakota Williams stated he needed to go pick up his kids. LCSW used compassion and understanding of his priorities at this time and stated that if he needed to get his kids session could be continued later. Dakota Williams was agreeable to July 15th at    Assessment/Plan: Pt endorses irritability, tension, and worry increase as this can be linked to himself and significant another dispute. He is still taking medications as prescribed. He does meet criteria for MDD and GAD. Plan for pt to f/u in three weeks.   Plan: Return again in 4 weeks.     [name], LCSW 02/08/2021

## 2021-02-13 ENCOUNTER — Ambulatory Visit (HOSPITAL_COMMUNITY): Payer: No Payment, Other | Admitting: Licensed Clinical Social Worker

## 2021-03-03 ENCOUNTER — Telehealth (HOSPITAL_COMMUNITY): Payer: Self-pay | Admitting: Licensed Clinical Social Worker

## 2021-03-03 ENCOUNTER — Ambulatory Visit (HOSPITAL_COMMUNITY): Payer: No Payment, Other | Admitting: Licensed Clinical Social Worker

## 2021-03-03 NOTE — Telephone Encounter (Signed)
LCSW sent link at 0903 and 0906 without response. At 0910 LCSW called pt, phone rang for a minute straight with no VM. LCSW sent another link at 0913 and disconnected at [phone]

## 2021-03-17 ENCOUNTER — Ambulatory Visit (HOSPITAL_COMMUNITY): Payer: Self-pay | Admitting: Licensed Clinical Social Worker

## 2021-03-24 ENCOUNTER — Ambulatory Visit (HOSPITAL_COMMUNITY): Payer: No Payment, Other | Admitting: Licensed Clinical Social Worker

## 2021-04-04 ENCOUNTER — Encounter (HOSPITAL_COMMUNITY): Payer: No Payment, Other | Admitting: Physician Assistant

## 2021-09-12 ENCOUNTER — Emergency Department (HOSPITAL_COMMUNITY)
Admission: EM | Admit: 2021-09-12 | Discharge: 2021-09-13 | Disposition: A | Discharge status: Home / Self Care | Payer: Managed Care, Other (non HMO) | Attending: Emergency Medicine | Admitting: Emergency Medicine

## 2021-09-12 ENCOUNTER — Other Ambulatory Visit: Payer: Self-pay

## 2021-09-12 ENCOUNTER — Encounter (HOSPITAL_COMMUNITY): Payer: Self-pay

## 2021-09-12 DIAGNOSIS — R079 Chest pain, unspecified: Secondary | ICD-10-CM

## 2021-09-12 DIAGNOSIS — R0789 Other chest pain: Secondary | ICD-10-CM | POA: Diagnosis present

## 2021-09-12 DIAGNOSIS — R519 Headache, unspecified: Secondary | ICD-10-CM | POA: Insufficient documentation

## 2021-09-12 DIAGNOSIS — J45909 Unspecified asthma, uncomplicated: Secondary | ICD-10-CM | POA: Diagnosis not present

## 2021-09-12 DIAGNOSIS — Z87891 Personal history of nicotine dependence: Secondary | ICD-10-CM | POA: Insufficient documentation

## 2021-09-12 LAB — CBC WITH DIFFERENTIAL/PLATELET
Abs Immature Granulocytes: 0.01 10*3/uL (ref 0.00–0.07)
Basophils Absolute: 0 10*3/uL (ref 0.0–0.1)
Basophils Relative: 1 %
Eosinophils Absolute: 0.3 10*3/uL (ref 0.0–0.5)
Eosinophils Relative: 3 %
HCT: 39.8 % (ref 39.0–52.0)
Hemoglobin: 13.1 g/dL (ref 13.0–17.0)
Immature Granulocytes: 0 %
Lymphocytes Relative: 38 %
Lymphs Abs: 3.1 10*3/uL (ref 0.7–4.0)
MCH: 30.7 pg (ref 26.0–34.0)
MCHC: 32.9 g/dL (ref 30.0–36.0)
MCV: 93.2 fL (ref 80.0–100.0)
Monocytes Absolute: 0.5 10*3/uL (ref 0.1–1.0)
Monocytes Relative: 6 %
Neutro Abs: 4.2 10*3/uL (ref 1.7–7.7)
Neutrophils Relative %: 52 %
Platelets: 269 10*3/uL (ref 150–400)
RBC: 4.27 MIL/uL (ref 4.22–5.81)
RDW: 11.9 % (ref 11.5–15.5)
WBC: 8.2 10*3/uL (ref 4.0–10.5)
nRBC: 0 % (ref 0.0–0.2)

## 2021-09-12 LAB — COMPREHENSIVE METABOLIC PANEL
ALT: 16 U/L (ref 0–44)
AST: 21 U/L (ref 15–41)
Albumin: 4.3 g/dL (ref 3.5–5.0)
Alkaline Phosphatase: 44 U/L (ref 38–126)
Anion gap: 6 (ref 5–15)
BUN: 14 mg/dL (ref 6–20)
CO2: 27 mmol/L (ref 22–32)
Calcium: 9.1 mg/dL (ref 8.9–10.3)
Chloride: 106 mmol/L (ref 98–111)
Creatinine, Ser: 1.09 mg/dL (ref 0.61–1.24)
GFR, Estimated: >60 mL/min (ref >60)
Glucose, Bld: 98 mg/dL (ref 70–99)
Potassium: 3.4 mmol/L — ABNORMAL LOW (ref 3.5–5.1)
Sodium: 139 mmol/L (ref 135–145)
Total Bilirubin: 0.6 mg/dL (ref 0.3–1.2)
Total Protein: 7.4 g/dL (ref 6.5–8.1)

## 2021-09-12 NOTE — ED Triage Notes (Addendum)
Headache and intermittent sternal chest pains for several days. Denies any pain currently.

## 2021-09-12 NOTE — ED Provider Triage Note (Signed)
Emergency Medicine Provider Triage Evaluation Note  TAVEN ORBIN , a 31 y.o. male  was evaluated in triage.  Pt complains of 3 days of a headache accompanied with shortness of breath and palpitations that started yesterday, as well as nausea.  He believes his shortness of breath was due to anxiety.  History of migraines and panic attacks in the past.  Usually experiences some type of aura before migraines started in the past.  Also complains of random episodes of dizziness when he is at work.  He denies experiencing shortness of breath or palpitations at this time.  Review of Systems  Positive: Headache, nausea, shortness of breath, palpitations, anxiety, dizziness Negative: Vomiting, syncope  Physical Exam  BP 128/90    Pulse 71    Temp 98.3 F (36.8 C)    Resp 18    Ht 6\' 3"  (1.905 m)    Wt 88.5 kg    SpO2 97%    BMI 24.37 kg/m  Gen:   Awake, no distress   Resp:  Normal effort  MSK:   Moves extremities without difficulty  Other:  Regular heart rate and rhythm  Medical Decision Making  Medically screening exam initiated at 10:19 PM.  Appropriate orders placed.  WAYDE NYBO was informed that the remainder of the evaluation will be completed by another provider, this initial triage assessment does not replace that evaluation, and the importance of remaining in the ED until their evaluation is complete.  Labs and EKG   Franchot Heidelberg, PA-C 09/12/21 2232

## 2021-09-13 ENCOUNTER — Emergency Department (HOSPITAL_COMMUNITY): Payer: Managed Care, Other (non HMO)

## 2021-09-13 LAB — TROPONIN I (HIGH SENSITIVITY): Troponin I (High Sensitivity): 2 ng/L (ref <18)

## 2021-09-13 NOTE — Discharge Instructions (Signed)
You were evaluated in the Emergency Department and after careful evaluation, we did not find any emergent condition requiring admission or further testing in the hospital.  Your exam/testing today was overall reassuring.  Please return to the Emergency Department if you experience any worsening of your condition.  Thank you for allowing us to be a part of your care.  

## 2021-09-13 NOTE — ED Provider Notes (Signed)
[facility] Emergency Department Provider Note MRN:  [id number]  Arrival date & time: 09/13/21     Chief Complaint   Headache   History of Present Illness   Dakota Williams is a 31 y.o. year-old male with a history of bipolar disorder presenting to the ED with chief complaint of headache.  Intermittent headaches recently.  Sharp chest pain for the past 3 days.  Intermittent.  Has been stressed at work.  Thinks he is overdoing it.  Denies shortness of breath, no leg pain or swelling, no other complaints.  Review of Systems  A thorough review of systems was obtained and all systems are negative except as noted in the HPI and PMH.   Patient's Health History    Past Medical History:  Diagnosis Date   Anxiety    Asthma    Bipolar 1 disorder ([place])    ETOH abuse    PTSD (post-traumatic stress disorder)     History reviewed. No pertinent surgical history.  No family history on file.  Social History   Socioeconomic History   Marital status: Single    Spouse name: Not on file   Number of children: Not on file   Years of education: Not on file   Highest education level: Not on file  Occupational History   Not on file  Tobacco Use   Smoking status: Former    Types: Cigarettes    Quit date: 04/20/2020    Years since quitting: 1.4   Smokeless tobacco: Never  Substance and Sexual Activity   Alcohol use: Not Currently    Comment: quit drinking 2020 of Jan 26th .    Drug use: No   Sexual activity: Not on file  Other Topics Concern   Not on file  Social History Narrative   Not on file   Social Determinants of Health   Financial Resource Strain: Not on file  Food Insecurity: Not on file  Transportation Needs: Not on file  Physical Activity: Not on file  Stress: Not on file  Social Connections: Not on file  Intimate Partner Violence: Not on file     Physical Exam   Vitals:   09/12/21 2217 09/13/21 0520  BP:  118/86  Pulse:  85  Resp: 18  12  Temp: 98.3 F (36.8 C) (!) 97.5 F (36.4 C)  SpO2:  99%    CONSTITUTIONAL: Well-appearing, NAD NEURO/PSYCH:  Alert and oriented x 3, no focal deficits EYES:  eyes equal and reactive ENT/NECK:  no LAD, no JVD CARDIO: Regular rate, well-perfused, normal S1 and S2 PULM:  CTAB no wheezing or rhonchi GI/GU:  non-distended, non-tender MSK/SPINE:  No gross deformities, no edema SKIN:  no rash, atraumatic   *Additional and/or pertinent findings included in MDM below  Diagnostic and Interventional Summary    EKG Interpretation  Date/Time:  Tuesday September 12 2021 23:02:33 EST Ventricular Rate:  82 PR Interval:  176 QRS Duration: 96 QT Interval:  366 QTC Calculation: 428 R Axis:   85 Text Interpretation: Sinus rhythm ST elevation suggests acute pericarditis Confirmed by Gerlene Fee [phone]) on 09/13/2021 5:27:02 AM       Labs Reviewed  COMPREHENSIVE METABOLIC PANEL - Abnormal; Notable for the following components:      Result Value   Potassium 3.4 (*)    All other components within normal limits  CBC WITH DIFFERENTIAL/PLATELET  TROPONIN I (HIGH SENSITIVITY)    DG Chest 2 View  Final Result  Medications - No data to display   Procedures  /  Critical Care Procedures  ED Course and Medical Decision Making  Initial Impression and Ddx Suspect benign cause of chest pain such as anxiety, costochondritis.  EKG demonstrating likely benign early repolarization versus pericarditis.  Awaiting chest x-ray, troponin to exclude any component of myocarditis.  Past medical/surgical history that increases complexity of ED encounter: None  Interpretation of Diagnostics I personally reviewed the EKG and my interpretation is as follows: Nonspecific ST segment changes    Labs are reassuring with negative troponin  Patient Reassessment and Ultimate Disposition/Management Discharge home  Patient management required discussion with the following services or consulting groups:   None  Complexity of Problems Addressed Acute complicated illness or Injury  Additional Data Reviewed and Analyzed Further history obtained from: None  Factors Impacting ED Encounter Risk None  Barth Kirks. [place]onia Small, [place] [email or web address]  Final Clinical Impressions(s) / ED Diagnoses     ICD-10-CM   1. Chest pain  R07.9 DG Chest 2 View    DG Chest 2 View      ED Discharge Orders     None        Discharge Instructions Discussed with and Provided to Patient:     Discharge Instructions      You were evaluated in the Emergency Department and after careful evaluation, we did not find any emergent condition requiring admission or further testing in the hospital.  Your exam/testing today was overall reassuring.  Please return to the Emergency Department if you experience any worsening of your condition.  Thank you for allowing [place] to be a part of your care.        Maudie Flakes, MD 09/13/21 [phone]

## 2022-02-05 ENCOUNTER — Emergency Department (HOSPITAL_COMMUNITY): Payer: No Typology Code available for payment source

## 2022-02-05 ENCOUNTER — Other Ambulatory Visit: Payer: Self-pay

## 2022-02-05 ENCOUNTER — Encounter (HOSPITAL_COMMUNITY): Payer: Self-pay | Admitting: Emergency Medicine

## 2022-02-05 ENCOUNTER — Emergency Department (HOSPITAL_COMMUNITY)
Admission: EM | Admit: 2022-02-05 | Discharge: 2022-02-06 | Disposition: A | Discharge status: Home / Self Care | Payer: No Typology Code available for payment source | Attending: Emergency Medicine | Admitting: Emergency Medicine

## 2022-02-05 DIAGNOSIS — R200 Anesthesia of skin: Secondary | ICD-10-CM

## 2022-02-05 DIAGNOSIS — R531 Weakness: Secondary | ICD-10-CM | POA: Insufficient documentation

## 2022-02-05 DIAGNOSIS — Y908 Blood alcohol level of 240 mg/100 ml or more: Secondary | ICD-10-CM | POA: Diagnosis not present

## 2022-02-05 DIAGNOSIS — J45909 Unspecified asthma, uncomplicated: Secondary | ICD-10-CM | POA: Diagnosis not present

## 2022-02-05 DIAGNOSIS — F10129 Alcohol abuse with intoxication, unspecified: Secondary | ICD-10-CM | POA: Insufficient documentation

## 2022-02-05 DIAGNOSIS — Y92410 Unspecified street and highway as the place of occurrence of the external cause: Secondary | ICD-10-CM | POA: Diagnosis not present

## 2022-02-05 DIAGNOSIS — R209 Unspecified disturbances of skin sensation: Secondary | ICD-10-CM | POA: Insufficient documentation

## 2022-02-05 DIAGNOSIS — Z20822 Contact with and (suspected) exposure to covid-19: Secondary | ICD-10-CM | POA: Diagnosis not present

## 2022-02-05 DIAGNOSIS — F10920 Alcohol use, unspecified with intoxication, uncomplicated: Secondary | ICD-10-CM

## 2022-02-05 DIAGNOSIS — R404 Transient alteration of awareness: Secondary | ICD-10-CM | POA: Diagnosis present

## 2022-02-05 LAB — SAMPLE TO BLOOD BANK

## 2022-02-05 LAB — PROTIME-INR
INR: 1 (ref 0.8–1.2)
Prothrombin Time: 12.9 seconds (ref 11.4–15.2)

## 2022-02-05 LAB — CBG MONITORING, ED: Glucose-Capillary: 76 mg/dL (ref 70–99)

## 2022-02-05 MED ORDER — SODIUM CHLORIDE 0.9 % IV BOLUS
1000.0000 mL | Freq: Once | INTRAVENOUS | Status: AC
  Administered 2022-02-05: 1000 mL via INTRAVENOUS

## 2022-02-05 MED ORDER — IOHEXOL 300 MG/ML  SOLN
100.0000 mL | Freq: Once | INTRAMUSCULAR | Status: AC | PRN
  Administered 2022-02-05: 100 mL via INTRAVENOUS
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
## 2022-02-05 NOTE — Progress Notes (Signed)
Orthopedic Tech Progress Note Patient Details:  Quention Mcneill 08-Dec-1990 [id number]  Patient ID: Londell Moh, male   DOB: Apr 10, 1991, 30 y.o.   MRN: [id number] Level II; not needed at the moment.  Darleen Crocker 02/05/2022, 11:15 PM

## 2022-02-05 NOTE — ED Notes (Signed)
Patient transported to CT with RN 
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
## 2022-02-06 ENCOUNTER — Emergency Department (HOSPITAL_COMMUNITY): Payer: No Typology Code available for payment source

## 2022-02-06 LAB — CBG MONITORING, ED: Glucose-Capillary: 89 mg/dL (ref 70–99)

## 2022-02-06 LAB — COMPREHENSIVE METABOLIC PANEL
ALT: 173 U/L — ABNORMAL HIGH (ref 0–44)
AST: 141 U/L — ABNORMAL HIGH (ref 15–41)
Albumin: 4.5 g/dL (ref 3.5–5.0)
Alkaline Phosphatase: 59 U/L (ref 38–126)
Anion gap: 11 (ref 5–15)
BUN: 6 mg/dL (ref 6–20)
CO2: 22 mmol/L (ref 22–32)
Calcium: 9.2 mg/dL (ref 8.9–10.3)
Chloride: 108 mmol/L (ref 98–111)
Creatinine, Ser: 1.13 mg/dL (ref 0.61–1.24)
GFR, Estimated: >60 mL/min (ref >60)
Glucose, Bld: 95 mg/dL (ref 70–99)
Potassium: 3.7 mmol/L (ref 3.5–5.1)
Sodium: 141 mmol/L (ref 135–145)
Total Bilirubin: 0.6 mg/dL (ref 0.3–1.2)
Total Protein: 7.8 g/dL (ref 6.5–8.1)

## 2022-02-06 LAB — CBC
HCT: 44.5 % (ref 39.0–52.0)
Hemoglobin: 15.2 g/dL (ref 13.0–17.0)
MCH: 32.5 pg (ref 26.0–34.0)
MCHC: 34.2 g/dL (ref 30.0–36.0)
MCV: 95.1 fL (ref 80.0–100.0)
Platelets: 199 10*3/uL (ref 150–400)
RBC: 4.68 MIL/uL (ref 4.22–5.81)
RDW: 12.3 % (ref 11.5–15.5)
WBC: 9.5 10*3/uL (ref 4.0–10.5)
nRBC: 0 % (ref 0.0–0.2)

## 2022-02-06 LAB — URINALYSIS, ROUTINE W REFLEX MICROSCOPIC
Bacteria, UA: NONE SEEN
Bilirubin Urine: NEGATIVE
Glucose, UA: NEGATIVE mg/dL
Ketones, ur: NEGATIVE mg/dL
Leukocytes,Ua: NEGATIVE
Nitrite: NEGATIVE
Protein, ur: NEGATIVE mg/dL
Specific Gravity, Urine: 1.011 (ref 1.005–1.030)
pH: 6 (ref 5.0–8.0)

## 2022-02-06 LAB — LACTIC ACID, PLASMA: Lactic Acid, Venous: 3.7 mmol/L (ref 0.5–1.9)

## 2022-02-06 LAB — RESP PANEL BY RT-PCR (FLU A&B, COVID) ARPGX2
Influenza A by PCR: NEGATIVE
Influenza B by PCR: NEGATIVE
SARS Coronavirus 2 by RT PCR: NEGATIVE

## 2022-02-06 LAB — ETHANOL: Alcohol, Ethyl (B): 287 mg/dL — ABNORMAL HIGH (ref <10)

## 2022-02-06 MED ORDER — DEXTROSE 50 % IV SOLN
1.0000 | Freq: Once | INTRAVENOUS | Status: DC
  Filled 2022-02-06: qty 50

## 2022-02-06 MED ORDER — SODIUM CHLORIDE 0.9 % IV BOLUS
1000.0000 mL | Freq: Once | INTRAVENOUS | Status: AC
  Administered 2022-02-06: 1000 mL via INTRAVENOUS

## 2022-02-06 MED ORDER — LORAZEPAM 2 MG/ML IJ SOLN
1.0000 mg | Freq: Once | INTRAMUSCULAR | Status: AC
  Administered 2022-02-06: 1 mg via INTRAVENOUS
  Filled 2022-02-06: qty 1

## 2022-02-06 MED ORDER — DEXTROSE 10 % IV SOLN: INTRAVENOUS | Status: DC
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
## 2022-02-06 NOTE — ED Notes (Signed)
Patient transported to MRI 

## 2022-02-06 NOTE — ED Notes (Signed)
Dr. Jacqulyn Bath informed of critical lactic 3.7
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
## 2022-02-06 NOTE — ED Notes (Signed)
Pt requesting to leave and refusing D50 and D10 infusion. MD informed and at bedside.
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
## 2022-02-06 NOTE — ED Notes (Signed)
Pt removed c-collar again and refused to let RN put it back on. Pt also refusing medication. Pt educated on importance of medications and c-collar. MD informed.
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
## 2022-02-26 ENCOUNTER — Other Ambulatory Visit: Payer: Self-pay

## 2022-02-26 ENCOUNTER — Emergency Department (HOSPITAL_BASED_OUTPATIENT_CLINIC_OR_DEPARTMENT_OTHER)
Admission: EM | Admit: 2022-02-26 | Discharge: 2022-02-26 | Disposition: A | Discharge status: Home / Self Care | Payer: Managed Care, Other (non HMO) | Attending: Emergency Medicine | Admitting: Emergency Medicine

## 2022-02-26 ENCOUNTER — Encounter (HOSPITAL_BASED_OUTPATIENT_CLINIC_OR_DEPARTMENT_OTHER): Payer: Self-pay

## 2022-02-26 DIAGNOSIS — K130 Diseases of lips: Secondary | ICD-10-CM | POA: Insufficient documentation

## 2022-02-26 DIAGNOSIS — T7840XA Allergy, unspecified, initial encounter: Secondary | ICD-10-CM

## 2022-02-26 DIAGNOSIS — F411 Generalized anxiety disorder: Secondary | ICD-10-CM

## 2022-02-26 DIAGNOSIS — T781XXA Other adverse food reactions, not elsewhere classified, initial encounter: Secondary | ICD-10-CM | POA: Diagnosis present

## 2022-02-26 MED ORDER — FAMOTIDINE 20 MG PO TABS
20.0000 mg | ORAL_TABLET | Freq: Once | ORAL | Status: AC
Start: 2022-02-26 — End: 2022-02-26
  Administered 2022-02-26: 20 mg via ORAL
  Filled 2022-02-26: qty 1

## 2022-02-26 MED ORDER — EPINEPHRINE 0.3 MG/0.3ML IJ SOAJ: 0.3000 mg | INTRAMUSCULAR | 0 refills | Status: AC | PRN

## 2022-02-26 NOTE — ED Provider Notes (Signed)
[facility] EMERGENCY DEPARTMENT Provider Note   CSN: [id number] Arrival date & time: 02/26/22  1840     History Chief Complaint  Patient presents with   Allergic Reaction    Dakota Williams is a 31 y.o. male with h/o bipolar, anxiety, and depression presents to the ED for evaluation of possible allergic reaction yesterday and today. Last night, the patient mentioned that he had a meatball hoagie that was beef and noted that he had some mild tongue and lip swelling. He reports that he felt like he had trouble swallowing unless he was eating his food and drinking water. He reports that he took a hydroxyzine and felt better. Tonight, the patient reports that he was eating ground beef and afterwards had the same feeling and was given a hydroxyzine and brought here for evaluation. The patient reports that he feels better now. Additionally, he mentions that he has several food and drug allergies. Denies any chest pain, SOB, sore throat, trouble breathing, syncope, facial swelling. Denies any nausea, vomiting, or rash.    Allergic Reaction      Home Medications Prior to Admission medications   Medication Sig Start Date End Date Taking? Authorizing Provider  gabapentin (NEURONTIN) 400 MG capsule Take 1 capsule (400 mg total) by mouth 3 (three) times daily. 09/21/18   Oneta Rack, NP  hydrOXYzine (ATARAX/VISTARIL) 25 MG tablet Take 1 tablet (25 mg total) by mouth 3 (three) times daily as needed for anxiety. 02/07/21   Nwoko, Tommas Olp, PA  ibuprofen (ADVIL,MOTRIN) 400 MG tablet Take 1 tablet (400 mg total) by mouth every 6 (six) hours as needed for moderate pain. 10/14/18   Alvira Monday, MD  lamoTRIgine (LAMICTAL) 25 MG tablet Take 2 tablets (50 mg total) by mouth daily. 02/07/21 02/07/22  Nwoko, Tommas Olp, PA  prazosin (MINIPRESS) 1 MG capsule Take 1 capsule (1 mg total) by mouth at bedtime. 02/07/21   Nwoko, Tommas Olp, PA  sertraline (ZOLOFT) 25 MG tablet Take 1 tablet (25  mg total) by mouth daily. 02/07/21 02/07/22  Nwoko, Tommas Olp, PA  traZODone (DESYREL) 100 MG tablet Take 1 tablet (100 mg total) by mouth at bedtime and may repeat dose one time if needed. 09/21/18   Oneta Rack, NP      Allergies    Naproxen, Penicillins, Shellfish allergy, and Vicodin [hydrocodone-acetaminophen]    Review of Systems   Review of Systems  Constitutional:  Negative for chills and fever.  HENT:  Negative for congestion, rhinorrhea and sore throat.        Reports lip and tongue swelling.   Respiratory:  Negative for shortness of breath.   Cardiovascular:  Negative for chest pain.  Gastrointestinal:  Negative for abdominal pain, nausea and vomiting.  Neurological:  Negative for light-headedness and headaches.    Physical Exam Updated Vital Signs BP 126/89   Pulse 64   Temp 98.5 F (36.9 C) (Oral)   Resp 16   Ht 6\' 3"  (1.905 m)   Wt 82.8 kg   SpO2 100%   BMI 22.82 kg/m  Physical Exam Vitals and nursing note reviewed.  Constitutional:      General: He is not in acute distress.    Appearance: Normal appearance. He is not ill-appearing or toxic-appearing.  HENT:     Head: Normocephalic and atraumatic.     Nose: Nose normal.     Mouth/Throat:     Mouth: Mucous membranes are moist.     Pharynx: No oropharyngeal  exudate or posterior oropharyngeal erythema.     Comments: No pharyngeal erythema, edema, or exudate noted. No swelling. Uvula midline. Airway patent. Controlling secretions. No facial, lip, or tongue swelling noted.  Eyes:     General: No scleral icterus. Cardiovascular:     Rate and Rhythm: Normal rate and regular rhythm.  Pulmonary:     Effort: Pulmonary effort is normal.     Breath sounds: Normal breath sounds.  Abdominal:     General: Abdomen is flat. Bowel sounds are normal.     Palpations: Abdomen is soft.  Musculoskeletal:        General: No deformity.     Cervical back: Normal range of motion.  Skin:    General: Skin is warm and dry.   Neurological:     General: No focal deficit present.     Mental Status: He is alert. Mental status is at baseline.     ED Results / Procedures / Treatments   Labs (all labs ordered are listed, but only abnormal results are displayed) Labs Reviewed - No data to display  EKG None  Radiology No results found.  Procedures Procedures   Medications Ordered in ED Medications  famotidine (PEPCID) tablet 20 mg (20 mg Oral Given 02/26/22 2258)    ED Course/ Medical Decision Making/ A&P                           Medical Decision Making Risk Prescription drug management.   31 year old male presents emerged department for evaluation of possible allergic reaction.  Differential diagnosis includes but is limited to angioedema, allergic reaction, oral fluid reaction, anxiety.  Vital signs show slightly elevated blood pressure otherwise normal.  Physical exam is unremarkable.  Lungs are clear to auscultation.  No respiratory distress.  Patient is controlling secretions and speaking in full sentences with ease.  There is no pharyngeal erythema, edema, or exudate noted.  Uvula midline.  Airway patent.  No signs of acute allergic reaction.  We will give the patient some Pepcid while he is here as he had had a hydroxyzine earlier.  Patient has a prescription for hydroxyzine.  We discussed using hydroxyzine if situations like this were to come up again however he needs to remove his food triggers.  We discussed that he had difficulty breathing, swallowing that he needs to use the EpiPen I prescribed him.  We discussed return precautions and red flag symptoms.  Patient verbalized understanding and agrees to the plan.  Patient is stable being discharged home in good condition.  I discussed this case with my attending physician who cosigned this note including patient's presenting symptoms, physical exam, and planned diagnostics and interventions. Attending physician stated agreement with plan or made  changes to plan which were implemented.   Final Clinical Impression(s) / ED Diagnoses Final diagnoses:  Allergic reaction, initial encounter    Rx / DC Orders ED Discharge Orders          Ordered    EPINEPHrine 0.3 mg/0.3 mL IJ SOAJ injection  As needed        02/26/22 2254              Achille Rich, PA-C 02/26/22 2324    Linwood Dibbles, MD 03/05/22 [phone]

## 2022-02-26 NOTE — Discharge Instructions (Addendum)
You were seen in the ER for evaluation of your tongue and lips swelling. Thankfully, they are unremarkable upon exam. If you have symptoms like you did tonight, take a hydroxyzine or benadryl, and come in to the ED for evaluation . However, if you can't breath, have trouble getting words out, you will need to give yourself the epi pen and come back to the ED for evaluation. Try to avoid your food allergies. Consider going to an allergy doctor for allergy testing. If you have any concern, new or worsening symptoms, please return to the nearest ER for re-evaluation.   Contact a health care provider if: Your symptoms do not get better with treatment. Get help right away if: You have symptoms of anaphylaxis. These include: Swollen mouth, tongue, or throat. Pain or tightness in your chest. Trouble breathing or shortness of breath. Dizziness or fainting. Severe abdominal pain, vomiting, or diarrhea. These symptoms may represent a serious problem that is an emergency. Do not wait to see if the symptoms will go away. Get medical help right away. Call your local emergency services (911 in the [place] Do not drive yourself to the hospital.

## 2022-02-26 NOTE — ED Triage Notes (Signed)
States he ate something and thinks he had an allergic reaction. States his head and tongue were throbbing. Took a hydroxyzine with improvement. From [facility].    States tongue "feels weird". Denies difficulty breathing/swallowing.

## 2022-07-22 ENCOUNTER — Emergency Department (HOSPITAL_COMMUNITY)
Admission: EM | Admit: 2022-07-22 | Discharge: 2022-07-22 | Disposition: A | Discharge status: Home / Self Care | Payer: No Typology Code available for payment source | Attending: Emergency Medicine | Admitting: Emergency Medicine

## 2022-07-22 ENCOUNTER — Other Ambulatory Visit: Payer: Self-pay

## 2022-07-22 ENCOUNTER — Emergency Department (HOSPITAL_COMMUNITY): Payer: No Typology Code available for payment source

## 2022-07-22 DIAGNOSIS — R0789 Other chest pain: Secondary | ICD-10-CM | POA: Insufficient documentation

## 2022-07-22 DIAGNOSIS — R52 Pain, unspecified: Secondary | ICD-10-CM

## 2022-07-22 DIAGNOSIS — Z1152 Encounter for screening for COVID-19: Secondary | ICD-10-CM | POA: Insufficient documentation

## 2022-07-22 DIAGNOSIS — Z7251 High risk heterosexual behavior: Secondary | ICD-10-CM

## 2022-07-22 DIAGNOSIS — R8281 Pyuria: Secondary | ICD-10-CM | POA: Insufficient documentation

## 2022-07-22 DIAGNOSIS — M791 Myalgia, unspecified site: Secondary | ICD-10-CM | POA: Insufficient documentation

## 2022-07-22 LAB — RESP PANEL BY RT-PCR (FLU A&B, COVID) ARPGX2
Influenza A by PCR: NEGATIVE
Influenza B by PCR: NEGATIVE
SARS Coronavirus 2 by RT PCR: NEGATIVE

## 2022-07-22 LAB — CBC
HCT: 41 % (ref 39.0–52.0)
Hemoglobin: 13.7 g/dL (ref 13.0–17.0)
MCH: 31.6 pg (ref 26.0–34.0)
MCHC: 33.4 g/dL (ref 30.0–36.0)
MCV: 94.5 fL (ref 80.0–100.0)
Platelets: 258 10*3/uL (ref 150–400)
RBC: 4.34 MIL/uL (ref 4.22–5.81)
RDW: 12 % (ref 11.5–15.5)
WBC: 8.9 10*3/uL (ref 4.0–10.5)
nRBC: 0 % (ref 0.0–0.2)

## 2022-07-22 LAB — URINALYSIS, ROUTINE W REFLEX MICROSCOPIC
Bacteria, UA: NONE SEEN
Bilirubin Urine: NEGATIVE
Glucose, UA: NEGATIVE mg/dL
Ketones, ur: 15 mg/dL — AB
Nitrite: NEGATIVE
Protein, ur: NEGATIVE mg/dL
Specific Gravity, Urine: 1.02 (ref 1.005–1.030)
pH: 6 (ref 5.0–8.0)

## 2022-07-22 LAB — COMPREHENSIVE METABOLIC PANEL
ALT: 9 U/L (ref 0–44)
AST: 35 U/L (ref 15–41)
Albumin: 4.7 g/dL (ref 3.5–5.0)
Alkaline Phosphatase: 47 U/L (ref 38–126)
Anion gap: 9 (ref 5–15)
BUN: 9 mg/dL (ref 6–20)
CO2: 22 mmol/L (ref 22–32)
Calcium: 9.5 mg/dL (ref 8.9–10.3)
Chloride: 106 mmol/L (ref 98–111)
Creatinine, Ser: 1.03 mg/dL (ref 0.61–1.24)
GFR, Estimated: >60 mL/min (ref >60)
Glucose, Bld: 84 mg/dL (ref 70–99)
Potassium: 3.5 mmol/L (ref 3.5–5.1)
Sodium: 137 mmol/L (ref 135–145)
Total Bilirubin: 0.8 mg/dL (ref 0.3–1.2)
Total Protein: 7.7 g/dL (ref 6.5–8.1)

## 2022-07-22 LAB — LIPASE, BLOOD: Lipase: 30 U/L (ref 11–51)

## 2022-07-22 MED ORDER — ONDANSETRON 4 MG PO TBDP
4.0000 mg | ORAL_TABLET | Freq: Once | ORAL | Status: AC | PRN
  Administered 2022-07-22: 4 mg via ORAL
  Filled 2022-07-22: qty 1

## 2022-07-22 MED ORDER — AZITHROMYCIN 250 MG PO TABS
2000.0000 mg | ORAL_TABLET | Freq: Once | ORAL | Status: AC
  Administered 2022-07-22: 2000 mg via ORAL
  Filled 2022-07-22: qty 8

## 2022-07-22 MED ORDER — GENTAMICIN SULFATE 40 MG/ML IJ SOLN
240.0000 mg | Freq: Once | INTRAMUSCULAR | Status: AC
  Administered 2022-07-22: 240 mg via INTRAMUSCULAR
  Filled 2022-07-22: qty 6

## 2022-07-22 NOTE — ED Notes (Signed)
Request for [facility] lab to be added to previously sent U/A sample received by [facility]. [employer]

## 2022-07-22 NOTE — Discharge Instructions (Addendum)
Your urine today was concerning for possible STD exposure. You have been treated with antibiotics today. Follow-up with the health department in 48 hours for the results of your STD tests. If you test positive for STDs, notify all sexual partners of their need to be tested and treated as well. Do not engage in sexual intercourse for one week. Use a condom when sexually active.

## 2022-07-22 NOTE — ED Triage Notes (Signed)
Body aches since yesterday. Onset of vomiting today x3. Temp 101

## 2022-07-23 LAB — GC/CHLAMYDIA PROBE AMP (~~LOC~~) NOT AT ARMC
Chlamydia: NEGATIVE
Comment: NEGATIVE
Comment: NORMAL
Neisseria Gonorrhea: NEGATIVE

## 2022-07-23 NOTE — ED Provider Notes (Signed)
[facility]EMERGENCY DEPT Provider Note   CSN: [id number] Arrival date & time: 07/22/22  0122     History  Chief Complaint  Patient presents with   Generalized Body Aches    Dakota Williams is a 31 y.o. male.  31 year old male presents to the emergency department for evaluation of body aches.  Symptoms began yesterday and have been persistent.  He has had vomiting x 3 today and reports temperature up to 101F.  Further reports some pain to his right lower chest aggravated with palpation and breathing.  He has not had any history of trauma.  Denies hemoptysis, syncope, abdominal pain, illicit drug use.  The history is provided by the patient. No language interpreter was used.       Home Medications Prior to Admission medications   Medication Sig Start Date End Date Taking? Authorizing Provider  EPINEPHrine 0.3 mg/0.3 mL IJ SOAJ injection Inject 0.3 mg into the muscle as needed for anaphylaxis. 02/26/22   Achille Rich, PA-C  gabapentin (NEURONTIN) 400 MG capsule Take 1 capsule (400 mg total) by mouth 3 (three) times daily. 09/21/18   Oneta Rack, NP  hydrOXYzine (ATARAX/VISTARIL) 25 MG tablet Take 1 tablet (25 mg total) by mouth 3 (three) times daily as needed for anxiety. 02/07/21   Nwoko, Tommas Olp, PA  ibuprofen (ADVIL,MOTRIN) 400 MG tablet Take 1 tablet (400 mg total) by mouth every 6 (six) hours as needed for moderate pain. 10/14/18   Alvira Monday, MD  lamoTRIgine (LAMICTAL) 25 MG tablet Take 2 tablets (50 mg total) by mouth daily. 02/07/21 02/07/22  Nwoko, Tommas Olp, PA  prazosin (MINIPRESS) 1 MG capsule Take 1 capsule (1 mg total) by mouth at bedtime. 02/07/21   Nwoko, Tommas Olp, PA  sertraline (ZOLOFT) 25 MG tablet Take 1 tablet (25 mg total) by mouth daily. 02/07/21 02/07/22  Nwoko, Tommas Olp, PA  traZODone (DESYREL) 100 MG tablet Take 1 tablet (100 mg total) by mouth at bedtime and may repeat dose one time if needed. 09/21/18   Oneta Rack, NP       Allergies    Naproxen, Penicillins, Shellfish allergy, and Vicodin [hydrocodone-acetaminophen]    Review of Systems   Review of Systems Ten systems reviewed and are negative for acute change, except as noted in the HPI.    Physical Exam Updated Vital Signs BP (!) 112/53   Pulse 62   Temp 98.3 F (36.8 C)   Resp 16   SpO2 100%   Physical Exam Vitals and nursing note reviewed.  Constitutional:      General: He is not in acute distress.    Appearance: He is well-developed. He is not diaphoretic.     Comments: Nontoxic appearing and in NAD  HENT:     Head: Normocephalic and atraumatic.  Eyes:     General: No scleral icterus.    Conjunctiva/sclera: Conjunctivae normal.  Cardiovascular:     Rate and Rhythm: Normal rate and regular rhythm.     Pulses: Normal pulses.  Pulmonary:     Effort: Pulmonary effort is normal. No respiratory distress.     Breath sounds: No stridor. No wheezing.     Comments: Lungs CTAB Chest:     Chest wall: Tenderness (R lower lateral chest wall. No crepitus.) present.  Musculoskeletal:        General: Normal range of motion.     Cervical back: Normal range of motion.  Skin:    General: Skin is warm and  dry.     Coloration: Skin is not pale.     Findings: No erythema or rash.  Neurological:     Mental Status: He is alert and oriented to person, place, and time.     Coordination: Coordination normal.  Psychiatric:        Behavior: Behavior normal.     ED Results / Procedures / Treatments   Labs (all labs ordered are listed, but only abnormal results are displayed) Labs Reviewed  URINALYSIS, ROUTINE W REFLEX MICROSCOPIC - Abnormal; Notable for the following components:      Result Value   Hgb urine dipstick TRACE (*)    Ketones, ur 15 (*)    Leukocytes,Ua TRACE (*)    All other components within normal limits  RESP PANEL BY RT-PCR (FLU A&B, COVID) ARPGX2  LIPASE, BLOOD  COMPREHENSIVE METABOLIC PANEL  CBC  GC/CHLAMYDIA PROBE AMP  ([place]) NOT AT [facility]    EKG None  Radiology DG Ribs Unilateral W/Chest Right  Result Date: 07/22/2022 CLINICAL DATA:  31 year old male with history of right-sided pleuritic chest pain for the past 3 days. Rib pain. EXAM: RIGHT RIBS AND CHEST - 3+ VIEW COMPARISON:  Chest x-ray 02/05/2022. FINDINGS: Lung volumes are normal. No consolidative airspace disease. No pleural effusions. No pneumothorax. No pulmonary nodule or mass noted. Pulmonary vasculature and the cardiomediastinal silhouette are within normal limits. Dedicated views of the right ribs demonstrate no acute displaced right-sided rib fractures. IMPRESSION: 1. No acute displaced right-sided rib fractures. 2. No radiographic evidence of acute cardiopulmonary disease. Electronically Signed   By: Trudie Reed M.D.   On: 07/22/2022 05:03    Procedures Procedures    Medications Ordered in ED Medications  ondansetron (ZOFRAN-ODT) disintegrating tablet 4 mg (4 mg Oral Given 07/22/22 0134)  gentamicin (GARAMYCIN) injection 240 mg (240 mg Intramuscular Given 07/22/22 0525)  azithromycin (ZITHROMAX) tablet 2,000 mg (2,000 mg Oral Given 07/22/22 0521)    ED Course/ Medical Decision Making/ A&P Clinical Course as of 07/23/22 0552  Sun Jul 22, 2022  0450 Patient's urinalysis concerning for possible asymptomatic STI.  Have added on GC chlamydia screening.  Plan to treat patient prior to discharge.  Per his allergy profile, he has possible anaphylactic reaction to penicillins.  Will avoid cephalosporins.  He has been ordered to receive gentamicin 240 mg IM as well as 2 g oral azithromycin, in keeping with CDC recommendations. [KH]    Clinical Course User Index [KH] Antony Madura, PA-C                           Medical Decision Making Amount and/or Complexity of Data Reviewed Labs: ordered. Radiology: ordered.  Risk Prescription drug management.   This patient presents to the ED for concern of body aches, this involves an  extensive number of treatment options, and is a complaint that carries with it a high risk of complications and morbidity.  The differential diagnosis includes viral illness vs rhabdomyolysis vs PNA   Co morbidities that complicate the patient evaluation  Bipolar 1 disorder ETOH abuse PTSD   Lab Tests:  I Ordered, and personally interpreted labs.  The pertinent results include:  Pyuria with WBC of 21-50 on UA. CBC, CMP, lipase, and respiratory panel all normal/negative   Imaging Studies ordered:  I ordered imaging studies including DG rib series  I independently visualized and interpreted imaging which showed no acute cardiopulmonary process. I agree with the radiologist interpretation  Cardiac Monitoring:  The patient was maintained on a cardiac monitor.  I personally viewed and interpreted the cardiac monitored which showed an underlying rhythm of: NSR   Medicines ordered and prescription drug management:  I ordered medication including Gentamicin and Azithromycin for STI coverage  Reevaluation of the patient after these medicines showed that the patient stayed the same I have reviewed the patients home medicines and have made adjustments as needed   Test Considered:  CK   Problem List / ED Course:  Patient hemodynamically stable with reassuring physical exam and laboratory evaluation His urinalysis shows nonspecific pyuria.  He has no urinary symptoms.  Doubt UTI.  He does report a history of unprotected sexual intercourse with multiple partners.  There is underlying concern for STI.  Patient was treated with gentamicin and azithromycin given cephalosporin allergy. Suspect body aches to be related to viral illness versus dehydration. Doubt urgent or emergent cause of complaints today.   Reevaluation:  After the interventions noted above, I reevaluated the patient and found that they have :stayed the same   Social Determinants of Health:  Insured  patient   Dispostion:  After consideration of the diagnostic results and the patients response to treatment, I feel that the patent would benefit from outpatient supportive care and PCP follow up. Return precautions discussed and provided. Patient discharged in stable condition with no unaddressed concerns.          Final Clinical Impression(s) / ED Diagnoses Final diagnoses:  Body aches  Pyuria  History of unprotected sex    Rx / DC Orders ED Discharge Orders     None         Antony Madura, PA-C 07/23/22 0603    Tilden Fossa, MD 07/23/22 [phone]

## 2024-06-11 ENCOUNTER — Other Ambulatory Visit: Payer: Self-pay

## 2024-06-11 ENCOUNTER — Encounter (HOSPITAL_BASED_OUTPATIENT_CLINIC_OR_DEPARTMENT_OTHER): Payer: Self-pay

## 2024-06-11 DIAGNOSIS — K0889 Other specified disorders of teeth and supporting structures: Secondary | ICD-10-CM | POA: Diagnosis present

## 2024-06-11 DIAGNOSIS — K029 Dental caries, unspecified: Secondary | ICD-10-CM | POA: Diagnosis not present

## 2024-06-11 DIAGNOSIS — J45909 Unspecified asthma, uncomplicated: Secondary | ICD-10-CM | POA: Diagnosis not present

## 2024-06-11 NOTE — ED Triage Notes (Signed)
 Pt reports L sided tooth pain that keeps him up at night reports throbbing and L side of face is swollen now. Denies fevers, difficulty swallowing.

## 2024-06-12 ENCOUNTER — Emergency Department (HOSPITAL_BASED_OUTPATIENT_CLINIC_OR_DEPARTMENT_OTHER)
Admission: EM | Admit: 2024-06-12 | Discharge: 2024-06-12 | Disposition: A | Discharge status: Home / Self Care | Payer: MEDICAID | Attending: Emergency Medicine | Admitting: Emergency Medicine

## 2024-06-12 DIAGNOSIS — K029 Dental caries, unspecified: Secondary | ICD-10-CM

## 2024-06-12 MED ORDER — ACETAMINOPHEN 500 MG PO TABS
1000.0000 mg | ORAL_TABLET | Freq: Once | ORAL | Status: AC
  Administered 2024-06-12: 1000 mg via ORAL
  Filled 2024-06-12: qty 2

## 2024-06-12 MED ORDER — CLINDAMYCIN HCL 150 MG PO CAPS: 300.0000 mg | ORAL_CAPSULE | Freq: Three times a day (TID) | ORAL | 0 refills | Status: AC

## 2024-06-12 NOTE — ED Provider Notes (Signed)
 [place] EMERGENCY DEPARTMENT AT [facility] Provider Note  CSN: [id number] Arrival date & time: 06/11/24 2141  Chief Complaint(s) Dental Pain  HPI Dakota Williams is a 33 y.o. male     Dental Pain Location:  Upper Upper teeth location:  12/LU 1st bicuspid Quality:  Constant Severity:  Moderate Onset quality:  Gradual Duration:  2 weeks Timing:  Constant Chronicity:  Recurrent Context: dental caries   Associated symptoms: facial pain   Associated symptoms: no fever and no oral lesions     Past Medical History Past Medical History:  Diagnosis Date   Anxiety    Asthma    Bipolar 1 disorder (HCC)    ETOH abuse    PTSD (post-traumatic stress disorder)    Patient Active Problem List   Diagnosis Date Noted   Panic disorder 10/19/2020   GAD (generalized anxiety disorder) 07/29/2020   Bipolar 2 disorder (HCC) 07/29/2020   PTSD (post-traumatic stress disorder) 07/29/2020   Alcohol abuse 09/22/2018   MDD (major depressive disorder), severe (HCC) 09/17/2018   Home Medication(s) Prior to Admission medications   Medication Sig Start Date End Date Taking? Authorizing Provider  clindamycin (CLEOCIN) 150 MG capsule Take 2 capsules (300 mg total) by mouth 3 (three) times daily for 7 days. 06/12/24 06/19/24 Yes Ilisa Hayworth, Raynell Moder, MD  EPINEPHrine  0.3 mg/0.3 mL IJ SOAJ injection Inject 0.3 mg into the muscle as needed for anaphylaxis. 02/26/22   Bernis Ernst, PA-C  gabapentin  (NEURONTIN ) 400 MG capsule Take 1 capsule (400 mg total) by mouth 3 (three) times daily. 09/21/18   Ezzard Staci SAILOR, NP  hydrOXYzine  (ATARAX /VISTARIL ) 25 MG tablet Take 1 tablet (25 mg total) by mouth 3 (three) times daily as needed for anxiety. 02/07/21   Nwoko, Uchenna E, PA  ibuprofen  (ADVIL ,MOTRIN ) 400 MG tablet Take 1 tablet (400 mg total) by mouth every 6 (six) hours as needed for moderate pain. 10/14/18   Dreama Longs, MD  lamoTRIgine  (LAMICTAL ) 25 MG tablet Take 2 tablets (50  mg total) by mouth daily. 02/07/21 02/07/22  Nwoko, Uchenna E, PA  prazosin  (MINIPRESS ) 1 MG capsule Take 1 capsule (1 mg total) by mouth at bedtime. 02/07/21   Nwoko, Uchenna E, PA  sertraline  (ZOLOFT ) 25 MG tablet Take 1 tablet (25 mg total) by mouth daily. 02/07/21 02/07/22  Nwoko, Uchenna E, PA  traZODone  (DESYREL ) 100 MG tablet Take 1 tablet (100 mg total) by mouth at bedtime and may repeat dose one time if needed. 09/21/18   Ezzard Staci SAILOR, NP                                                                                                                                    Allergies Naproxen , Penicillins, Shellfish allergy, Vicodin [hydrocodone-acetaminophen ], and Trazodone  and nefazodone  Review of Systems Review of Systems  Constitutional:  Negative for fever.  HENT:  Negative for mouth sores.    As noted  in HPI  Physical Exam Vital Signs  I have reviewed the triage vital signs BP 124/86 (BP Location: Right Arm)   Pulse 79   Temp 98.4 F (36.9 C)   Resp 20   Ht 6' 3 (1.905 m)   Wt 81.6 kg   SpO2 98%   BMI 22.50 kg/m   Physical Exam Vitals reviewed.  Constitutional:      General: He is not in acute distress.    Appearance: He is well-developed. He is not diaphoretic.  HENT:     Head: Normocephalic and atraumatic.     Right Ear: External ear normal.     Left Ear: External ear normal.     Nose: Nose normal.     Mouth/Throat:     Mouth: Mucous membranes are moist.     Dentition: Abnormal dentition. Dental caries present. No gingival swelling or dental abscesses.   Eyes:     General: No scleral icterus.    Conjunctiva/sclera: Conjunctivae normal.  Neck:     Trachea: Phonation normal.  Cardiovascular:     Rate and Rhythm: Normal rate and regular rhythm.  Pulmonary:     Effort: Pulmonary effort is normal. No respiratory distress.     Breath sounds: No stridor.  Abdominal:     General: There is no distension.  Musculoskeletal:        General: Normal range of motion.      Cervical back: Normal range of motion.  Neurological:     Mental Status: He is alert and oriented to person, place, and time.  Psychiatric:        Behavior: Behavior normal.     ED Results and Treatments Labs (all labs ordered are listed, but only abnormal results are displayed) Labs Reviewed - No data to display                                                                                                                       EKG  EKG Interpretation Date/Time:    Ventricular Rate:    PR Interval:    QRS Duration:    QT Interval:    QTC Calculation:   R Axis:      Text Interpretation:         Radiology No results found.  Medications Ordered in ED Medications  acetaminophen  (TYLENOL ) tablet 1,000 mg (has no administration in time range)   Procedures Procedures  (including critical care time) Medical Decision Making / ED Course   Medical Decision Making   Dental pain associated with dental carrie. No obvious abscess. Possible pulpitis. Tylenol  and Clinda. Dentist f/u.    Final Clinical Impression(s) / ED Diagnoses Final diagnoses:  Pain due to dental caries   The patient appears reasonably screened and/or stabilized for discharge and I doubt any other medical condition or other [facility] requiring further screening, evaluation, or treatment in the ED at this time. I have discussed the findings, Dx and Tx plan with the patient/family who expressed understanding  and agree(s) with the plan. Discharge instructions discussed at length. The patient/family was given strict return precautions who verbalized understanding of the instructions. No further questions at time of discharge.  Disposition: Discharge  Condition: Good  ED Discharge Orders          Ordered    clindamycin (CLEOCIN) 150 MG capsule  3 times daily        06/12/24 0216              Follow Up: Dentist  Call      This chart was dictated using voice recognition software.  Despite best  efforts to proofread,  errors can occur which can change the documentation meaning.    Trine Raynell Moder, MD 06/12/24 [phone]
